# Patient Record
Sex: Female | Born: 1975
Health system: Southern US, Community
[De-identification: ages and names within clinical notes are randomized; demographics above are authoritative.]

## PROBLEM LIST (undated history)

## (undated) DIAGNOSIS — R6510 Systemic inflammatory response syndrome (SIRS) of non-infectious origin without acute organ dysfunction: Secondary | ICD-10-CM

## (undated) DIAGNOSIS — E0590 Thyrotoxicosis, unspecified without thyrotoxic crisis or storm: Secondary | ICD-10-CM

---

## 2017-09-26 ENCOUNTER — Emergency Department (HOSPITAL_BASED_OUTPATIENT_CLINIC_OR_DEPARTMENT_OTHER)
Admission: EM | Admit: 2017-09-26 | Discharge: 2017-09-26 | Disposition: A | Discharge status: Home / Self Care | Payer: Medicaid Other | Attending: Emergency Medicine | Admitting: Emergency Medicine

## 2017-09-26 ENCOUNTER — Other Ambulatory Visit: Payer: Self-pay

## 2017-09-26 ENCOUNTER — Encounter (HOSPITAL_BASED_OUTPATIENT_CLINIC_OR_DEPARTMENT_OTHER): Payer: Self-pay | Admitting: *Deleted

## 2017-09-26 DIAGNOSIS — R21 Rash and other nonspecific skin eruption: Secondary | ICD-10-CM | POA: Diagnosis present

## 2017-09-26 DIAGNOSIS — R140 Abdominal distension (gaseous): Secondary | ICD-10-CM | POA: Diagnosis not present

## 2017-09-26 DIAGNOSIS — L282 Other prurigo: Secondary | ICD-10-CM

## 2017-09-26 DIAGNOSIS — R110 Nausea: Secondary | ICD-10-CM | POA: Diagnosis not present

## 2017-09-26 LAB — URINALYSIS, ROUTINE W REFLEX MICROSCOPIC
BILIRUBIN URINE: NEGATIVE
Glucose, UA: NEGATIVE mg/dL
Hgb urine dipstick: NEGATIVE
Ketones, ur: NEGATIVE mg/dL
NITRITE: NEGATIVE
Protein, ur: NEGATIVE mg/dL
SPECIFIC GRAVITY, URINE: 1.02 (ref 1.005–1.030)
pH: 6.5 (ref 5.0–8.0)

## 2017-09-26 LAB — URINALYSIS, MICROSCOPIC (REFLEX): RBC / HPF: NONE SEEN RBC/hpf (ref 0–5)

## 2017-09-26 LAB — PREGNANCY, URINE: Preg Test, Ur: NEGATIVE

## 2017-09-26 MED ORDER — HYDROXYZINE HCL 25 MG PO TABS: 25.0000 mg | ORAL_TABLET | Freq: Four times a day (QID) | ORAL | 0 refills | Status: DC

## 2017-09-26 MED ORDER — PREDNISONE 20 MG PO TABS: ORAL_TABLET | ORAL | 0 refills | Status: DC

## 2017-09-26 MED ORDER — EUCERIN EX CREA
TOPICAL_CREAM | CUTANEOUS | 0 refills | Status: DC | PRN
Start: 2017-09-26 — End: 2018-04-11

## 2017-09-26 MED FILL — hydrOXYzine HCL 25 MG TABS: 25 | 3 days supply | Qty: 12 | Fill #0

## 2017-09-26 MED FILL — predniSONE 20 MG TABS: 20 | 5 days supply | Qty: 11 | Fill #0

## 2017-09-26 NOTE — ED Provider Notes (Signed)
[facility] EMERGENCY DEPARTMENT Provider Note   CSN: [id number] Arrival date & time: 09/26/17  1303     History   Chief Complaint Chief Complaint  Patient presents with  . Abdominal Pain    HPI Angie White is a 41 y.o. female.  Patient speaks Clydie BraunKaren. Interpreter service utilized to obtain history and ROS. Patient with pruritic rash, worse at night, for 2 weeks. No new foods, soaps, or detergents. No animals at home.  No shortness of breath, sore throat, or difficulty swallowing. Reports occasional abdominal discomfort, described as "gas" with bloating, occasional nausea, no vomiting or diarrhea. No focal area of abdominal discomfort. No fevers. No urinary symptoms.   The history is provided by the patient. The history is limited by a language barrier. A language interpreter was used.  Rash   This is a recurrent problem. The current episode started more than 1 week ago. The problem has been gradually worsening. The problem is associated with an unknown factor. There has been no fever. The rash is present on the torso, right arm, left upper leg, left lower leg, left arm, right upper leg, right lower leg, right foot and left foot. Associated symptoms include itching.    History reviewed. No pertinent past medical history.  There are no active problems to display for this patient.   History reviewed. No pertinent surgical history.  OB History    No data available       Home Medications    Prior to Admission medications   Not on File    Family History No family history on file.  Social History Social History   Tobacco Use  . Smoking status: Never Smoker  Substance Use Topics  . Alcohol use: No    Frequency: Never  . Drug use: No     Allergies   Patient has no known allergies.   Review of Systems Review of Systems  Constitutional: Negative for chills and fever.  Respiratory: Negative for shortness of breath.   Gastrointestinal: Positive for  nausea. Negative for diarrhea and vomiting.  Genitourinary: Negative for difficulty urinating, dysuria and urgency.  Skin: Positive for itching and rash.  All other systems reviewed and are negative.    Physical Exam Updated Vital Signs BP 138/65 (BP Location: Right Arm)   Pulse (!) 118   Temp 98.3 F (36.8 C) (Oral)   Resp 14   Ht 4\' 8"  (1.422 m)   Wt 53.1 kg (117 lb)   SpO2 98%   BMI 26.23 kg/m   Physical Exam  Constitutional: She is oriented to person, place, and time. She appears well-developed and well-nourished. She does not appear ill.  Eyes: Conjunctivae are normal.  Neck: Neck supple.  Cardiovascular: Normal rate and regular rhythm.  Pulmonary/Chest: Effort normal and breath sounds normal.  Abdominal: Soft. Bowel sounds are normal. She exhibits no distension. There is no tenderness.  Musculoskeletal: Normal range of motion. She exhibits edema.  Lymphadenopathy:    She has no cervical adenopathy.  Neurological: She is alert and oriented to person, place, and time.  Skin: Skin is warm and dry. Rash noted.  Psychiatric: She has a normal mood and affect.  Nursing note and vitals reviewed.    ED Treatments / Results  Labs (all labs ordered are listed, but only abnormal results are displayed) Labs Reviewed  URINALYSIS, ROUTINE W REFLEX MICROSCOPIC - Abnormal; Notable for the following components:      Result Value   Leukocytes, UA SMALL (*)  All other components within normal limits  URINALYSIS, MICROSCOPIC (REFLEX) - Abnormal; Notable for the following components:   Bacteria, UA RARE (*)    Squamous Epithelial / LPF 0-5 (*)    All other components within normal limits  PREGNANCY, URINE    EKG  EKG Interpretation None       Radiology No results found.  Procedures Procedures (including critical care time)  Medications Ordered in ED Medications - No data to display   Initial Impression / Assessment and Plan / ED Course  I have reviewed the  triage vital signs and the nursing notes.  Pertinent labs & imaging results that were available during my care of the patient were reviewed by me and considered in my medical decision making (see chart for details).    Patient with nonspecific eruption. No signs of infection. Discharge with symptomatic treatment. Follow up in 2-3 days if no improvement.   Final Clinical Impressions(s) / ED Diagnoses   Final diagnoses:  Pruritic rash    ED Discharge Orders        Ordered    predniSONE (DELTASONE) 20 MG tablet     09/26/17 1659    hydrOXYzine (ATARAX/VISTARIL) 25 MG tablet  Every 6 hours     09/26/17 1659    Skin Protectants, Misc. (EUCERIN) cream  As needed     09/26/17 1659       Felicie MornSmith, Areya Lemmerman, NP 09/26/17 1701    Gwyneth SproutPlunkett, Whitney, MD 09/27/17 [phone]

## 2017-09-26 NOTE — ED Triage Notes (Addendum)
Pt c/o rash x 2 weeks, also c/o abd pain and vomiting with weight loss x 2 weeks

## 2017-09-26 NOTE — ED Notes (Signed)
ED Provider at bedside. 

## 2017-10-02 ENCOUNTER — Other Ambulatory Visit: Payer: Self-pay

## 2017-10-02 ENCOUNTER — Emergency Department (HOSPITAL_BASED_OUTPATIENT_CLINIC_OR_DEPARTMENT_OTHER): Payer: Medicaid Other

## 2017-10-02 ENCOUNTER — Observation Stay (HOSPITAL_BASED_OUTPATIENT_CLINIC_OR_DEPARTMENT_OTHER)
Admission: EM | Admit: 2017-10-02 | Discharge: 2017-10-03 | Disposition: A | Discharge status: Home / Self Care | Payer: Medicaid Other | Attending: Family Medicine | Admitting: Family Medicine

## 2017-10-02 ENCOUNTER — Encounter (HOSPITAL_BASED_OUTPATIENT_CLINIC_OR_DEPARTMENT_OTHER): Payer: Self-pay | Admitting: Emergency Medicine

## 2017-10-02 DIAGNOSIS — E0590 Thyrotoxicosis, unspecified without thyrotoxic crisis or storm: Secondary | ICD-10-CM | POA: Insufficient documentation

## 2017-10-02 DIAGNOSIS — Z23 Encounter for immunization: Secondary | ICD-10-CM | POA: Diagnosis not present

## 2017-10-02 DIAGNOSIS — R6510 Systemic inflammatory response syndrome (SIRS) of non-infectious origin without acute organ dysfunction: Secondary | ICD-10-CM | POA: Diagnosis not present

## 2017-10-02 DIAGNOSIS — R945 Abnormal results of liver function studies: Secondary | ICD-10-CM | POA: Insufficient documentation

## 2017-10-02 DIAGNOSIS — R7989 Other specified abnormal findings of blood chemistry: Secondary | ICD-10-CM | POA: Diagnosis present

## 2017-10-02 DIAGNOSIS — R51 Headache: Secondary | ICD-10-CM | POA: Diagnosis present

## 2017-10-02 LAB — CBC WITH DIFFERENTIAL/PLATELET
BASOS ABS: 0 10*3/uL (ref 0.0–0.1)
BASOS PCT: 0 %
EOS ABS: 0.3 10*3/uL (ref 0.0–0.7)
Eosinophils Relative: 3 %
HEMATOCRIT: 41.6 % (ref 36.0–46.0)
HEMOGLOBIN: 14.3 g/dL (ref 12.0–15.0)
Lymphocytes Relative: 29 %
Lymphs Abs: 3.3 10*3/uL (ref 0.7–4.0)
MCH: 29.1 pg (ref 26.0–34.0)
MCHC: 34.4 g/dL (ref 30.0–36.0)
MCV: 84.6 fL (ref 78.0–100.0)
MONO ABS: 1.2 10*3/uL — AB (ref 0.1–1.0)
Monocytes Relative: 10 %
NEUTROS ABS: 6.3 10*3/uL (ref 1.7–7.7)
NEUTROS PCT: 58 %
Platelets: 267 10*3/uL (ref 150–400)
RBC: 4.92 MIL/uL (ref 3.87–5.11)
RDW: 12.4 % (ref 11.5–15.5)
WBC: 11.1 10*3/uL — ABNORMAL HIGH (ref 4.0–10.5)

## 2017-10-02 LAB — LIPASE, BLOOD: Lipase: 28 U/L (ref 11–51)

## 2017-10-02 LAB — URINALYSIS, ROUTINE W REFLEX MICROSCOPIC
Bilirubin Urine: NEGATIVE
GLUCOSE, UA: NEGATIVE mg/dL
Hgb urine dipstick: NEGATIVE
KETONES UR: NEGATIVE mg/dL
NITRITE: NEGATIVE
PH: 6.5 (ref 5.0–8.0)
Protein, ur: NEGATIVE mg/dL
SPECIFIC GRAVITY, URINE: 1.025 (ref 1.005–1.030)

## 2017-10-02 LAB — COMPREHENSIVE METABOLIC PANEL
ALBUMIN: 3.6 g/dL (ref 3.5–5.0)
ALT: 101 U/L — ABNORMAL HIGH (ref 14–54)
AST: 58 U/L — AB (ref 15–41)
Alkaline Phosphatase: 98 U/L (ref 38–126)
Anion gap: 9 (ref 5–15)
BILIRUBIN TOTAL: 0.7 mg/dL (ref 0.3–1.2)
BUN: 16 mg/dL (ref 6–20)
CO2: 25 mmol/L (ref 22–32)
Calcium: 9.6 mg/dL (ref 8.9–10.3)
Chloride: 102 mmol/L (ref 101–111)
Creatinine, Ser: 0.44 mg/dL (ref 0.44–1.00)
GFR calc Af Amer: >60 mL/min (ref >60)
GFR calc non Af Amer: >60 mL/min (ref >60)
GLUCOSE: 124 mg/dL — AB (ref 65–99)
POTASSIUM: 3.4 mmol/L — AB (ref 3.5–5.1)
SODIUM: 136 mmol/L (ref 135–145)
TOTAL PROTEIN: 7.2 g/dL (ref 6.5–8.1)

## 2017-10-02 LAB — URINALYSIS, MICROSCOPIC (REFLEX): RBC / HPF: NONE SEEN RBC/hpf (ref 0–5)

## 2017-10-02 LAB — T4, FREE: Free T4: 5.26 ng/dL — ABNORMAL HIGH (ref 0.61–1.12)

## 2017-10-02 LAB — TSH

## 2017-10-02 LAB — PREGNANCY, URINE: Preg Test, Ur: NEGATIVE

## 2017-10-02 MED ORDER — ONDANSETRON HCL 4 MG/2ML IJ SOLN: 4.0000 mg | Freq: Four times a day (QID) | INTRAMUSCULAR | Status: DC | PRN

## 2017-10-02 MED ORDER — SODIUM CHLORIDE 0.9 % IV SOLN
INTRAVENOUS | Status: DC
  Administered 2017-10-02 – 2017-10-03 (×2): via INTRAVENOUS

## 2017-10-02 MED ORDER — METOCLOPRAMIDE HCL 5 MG/ML IJ SOLN
10.0000 mg | Freq: Once | INTRAMUSCULAR | Status: AC
  Administered 2017-10-02: 10 mg via INTRAVENOUS
  Filled 2017-10-02: qty 2

## 2017-10-02 MED ORDER — PROPRANOLOL HCL 1 MG/ML IV SOLN
1.0000 mg | Freq: Once | INTRAVENOUS | Status: AC
Start: 2017-10-02 — End: 2017-10-02
  Administered 2017-10-02: 1 mg via INTRAVENOUS
  Filled 2017-10-02: qty 1

## 2017-10-02 MED ORDER — ACETAMINOPHEN 650 MG RE SUPP: 650.0000 mg | Freq: Four times a day (QID) | RECTAL | Status: DC | PRN

## 2017-10-02 MED ORDER — ONDANSETRON HCL 4 MG PO TABS: 4.0000 mg | ORAL_TABLET | Freq: Four times a day (QID) | ORAL | Status: DC | PRN

## 2017-10-02 MED ORDER — ACETAMINOPHEN 325 MG PO TABS: 650.0000 mg | ORAL_TABLET | Freq: Four times a day (QID) | ORAL | Status: DC | PRN

## 2017-10-02 MED ORDER — GI COCKTAIL ~~LOC~~
30.0000 mL | Freq: Once | ORAL | Status: AC
  Administered 2017-10-02: 30 mL via ORAL
  Filled 2017-10-02: qty 30

## 2017-10-02 MED ORDER — PROPRANOLOL HCL 40 MG PO TABS
40.0000 mg | ORAL_TABLET | Freq: Once | ORAL | Status: DC
  Filled 2017-10-02: qty 1

## 2017-10-02 MED ORDER — SODIUM CHLORIDE 0.9 % IV BOLUS (SEPSIS)
1000.0000 mL | Freq: Once | INTRAVENOUS | Status: AC
  Administered 2017-10-02: 1000 mL via INTRAVENOUS

## 2017-10-02 MED ORDER — DEXAMETHASONE SODIUM PHOSPHATE 10 MG/ML IJ SOLN
10.0000 mg | Freq: Once | INTRAMUSCULAR | Status: AC
  Administered 2017-10-02: 10 mg via INTRAVENOUS
  Filled 2017-10-02: qty 1

## 2017-10-02 MED ORDER — METOPROLOL TARTRATE 25 MG PO TABS
12.5000 mg | ORAL_TABLET | Freq: Two times a day (BID) | ORAL | Status: DC
  Administered 2017-10-02 – 2017-10-03 (×2): 12.5 mg via ORAL
  Filled 2017-10-02 (×2): qty 1

## 2017-10-02 MED ORDER — DIPHENHYDRAMINE HCL 50 MG/ML IJ SOLN
25.0000 mg | Freq: Once | INTRAMUSCULAR | Status: AC
  Administered 2017-10-02: 25 mg via INTRAVENOUS
  Filled 2017-10-02: qty 1

## 2017-10-02 MED ORDER — INFLUENZA VAC SPLIT QUAD 0.5 ML IM SUSY
0.5000 mL | PREFILLED_SYRINGE | INTRAMUSCULAR | Status: AC
  Administered 2017-10-03: 0.5 mL via INTRAMUSCULAR
  Filled 2017-10-02: qty 0.5

## 2017-10-02 NOTE — ED Provider Notes (Signed)
[facility] EMERGENCY DEPARTMENT Provider Note   CSN: [id number] Arrival date & time: 10/02/17  0840     History   Chief Complaint Chief Complaint  Patient presents with  . Headache  . Medication Reaction    HPI Angie White is a 41 y.o. female.  Patient presents to the emergency department with a chief complaint of multiple problems.  She reports having nausea, vomiting, generalized pruritus for the past several weeks.  She states that she was seen here approximately 1 week ago and prescribed prednisone, Atarax, and Eucerin.  She states that she did have some good relief with this medication, but now she is out, and her symptoms have returned.  She reports having daily episodes of vomiting.  She reports anorexia.  Additionally, she states that she began having a headache last night which is progressively worsened until now.  She denies any new numbness, weakness, or tingling, but states that she did have some tingling in her right hand approximately 1 week ago.  This has resolved.  Denies any chest pain or shortness of breath, but does report feeling fatigued.  She denies any abdominal pain, hematemesis, hematochezia, or melena.  She reports that she has been constipated.  Denies any recent foreign travel.  Denies any other medical problems.   The history is provided by the patient. The history is limited by a language barrier. A language interpreter was used.    No past medical history on file.  There are no active problems to display for this patient.   No past surgical history on file.  OB History    No data available       Home Medications    Prior to Admission medications   Medication Sig Start Date End Date Taking? Authorizing Provider  hydrOXYzine (ATARAX/VISTARIL) 25 MG tablet Take 1 tablet (25 mg total) by mouth every 6 (six) hours. 09/26/17   Felicie MornSmith, David, NP  predniSONE (DELTASONE) 20 MG tablet 3 tabs po day one, then 2 tabs daily x 4 days 09/26/17    Felicie MornSmith, David, NP  Skin Protectants, Misc. (EUCERIN) cream Apply topically as needed for dry skin. 09/26/17   Felicie MornSmith, David, NP    Family History No family history on file.  Social History Social History   Tobacco Use  . Smoking status: Never Smoker  . Smokeless tobacco: Never Used  Substance Use Topics  . Alcohol use: No    Frequency: Never  . Drug use: No     Allergies   Patient has no known allergies.   Review of Systems Review of Systems  All other systems reviewed and are negative.    Physical Exam Updated Vital Signs BP 130/81   Pulse 100   Temp 97.9 F (36.6 C)   Resp 16   Ht 4\' 8"  (1.422 m)   Wt 53.1 kg (117 lb)   SpO2 96%   BMI 26.23 kg/m   Physical Exam  Constitutional: She is oriented to person, place, and time. She appears well-developed and well-nourished.  HENT:  Head: Normocephalic and atraumatic.  Right Ear: External ear normal.  Left Ear: External ear normal.  Eyes: Conjunctivae and EOM are normal. Pupils are equal, round, and reactive to light.  Neck: Normal range of motion. Neck supple.  No pain with neck flexion, no meningismus  Cardiovascular: Normal rate, regular rhythm and normal heart sounds. Exam reveals no gallop and no friction rub.  No murmur heard. Pulmonary/Chest: Effort normal and breath sounds normal. No  respiratory distress. She has no wheezes. She has no rales. She exhibits no tenderness.  Lungs are clear to auscultation  Abdominal: Soft. Bowel sounds are normal. She exhibits no distension and no mass. There is no tenderness. There is no rebound and no guarding.  No focal tenderness  Musculoskeletal: Normal range of motion. She exhibits no edema or tenderness.  Normal gait.  Neurological: She is alert and oriented to person, place, and time. She has normal reflexes.  CN 3-12 intact, normal finger to nose, no pronator drift, sensation and strength intact bilaterally.  Skin: Skin is warm and dry.  No visible rash    Psychiatric: She has a normal mood and affect. Her behavior is normal. Judgment and thought content normal.  Nursing note and vitals reviewed.    ED Treatments / Results  Labs (all labs ordered are listed, but only abnormal results are displayed) Labs Reviewed  CBC WITH DIFFERENTIAL/PLATELET  COMPREHENSIVE METABOLIC PANEL  LIPASE, BLOOD  URINALYSIS, ROUTINE W REFLEX MICROSCOPIC  PREGNANCY, URINE  TSH    EKG  EKG Interpretation None       Radiology No results found.  Procedures Procedures (including critical care time) CRITICAL CARE Performed by: Roxy Horsemanobert Tierre Netto   Total critical care time: 48 minutes  Critical care time was exclusive of separately billable procedures and treating other patients.  Critical care was necessary to treat or prevent imminent or life-threatening deterioration.  Critical care was time spent personally by me on the following activities: development of treatment plan with patient and/or surrogate as well as nursing, discussions with consultants, evaluation of patient's response to treatment, examination of patient, obtaining history from patient or surrogate, ordering and performing treatments and interventions, ordering and review of laboratory studies, ordering and review of radiographic studies, pulse oximetry and re-evaluation of patient's condition.  Medications Ordered in ED Medications  sodium chloride 0.9 % bolus 1,000 mL (not administered)  gi cocktail (Maalox,Lidocaine,Donnatal) (not administered)  metoCLOPramide (REGLAN) injection 10 mg (not administered)  diphenhydrAMINE (BENADRYL) injection 25 mg (not administered)  dexamethasone (DECADRON) injection 10 mg (not administered)     Initial Impression / Assessment and Plan / ED Course  I have reviewed the triage vital signs and the nursing notes.  Pertinent labs & imaging results that were available during my care of the patient were reviewed by me and considered in my medical  decision making (see chart for details).     With many complaints, including headache, nausea, vomiting, dizziness, pruritus, fatigue, anorexia, all of which have been ongoing for the past week or so.  Headache worsened last night and into today.  Triage.  Will give fluids.  Will check labs.  Patient may benefit from CT imaging of her head.  Will check TSH, EKG.  Will try GI cocktail and headache cocktail.  Patient is nontoxic-appearing.   Patient feels improved, but remains tachycardic to the 110s.  Normal temp.  Does have nausea and vomiting.  TSH <0.01.  This is new.  Will give IV propranolol.    Discussed with Dr. Rush Landmarkegeler, who agrees with plan for admission.  Appreciate Dr. Rito EhrlichKrishnan [facility].  Final Clinical Impressions(s) / ED Diagnoses   Final diagnoses:  Hyperthyroidism    ED Discharge Orders    None       Roxy HorsemanBrowning, Avryl Roehm, PA-C 10/02/17 [id number]    Tegeler, Canary Brimhristopher J, MD 10/02/17 [phone]

## 2017-10-02 NOTE — ED Notes (Signed)
Patient is resting comfortably. 

## 2017-10-02 NOTE — ED Notes (Signed)
Pt to radiology.

## 2017-10-02 NOTE — ED Triage Notes (Signed)
Pt started having headache, some dizziness, nausea and vomiting that began several days after taking medication given in ED.  Symptoms worse last night.

## 2017-10-02 NOTE — H&P (Signed)
History and Physical    Angie White ZOX:[id number] DOB: 08/09/1976 DOA: 10/02/2017  PCP: [facility]  Patient coming from: Home.  Chief Complaint: Headache palpitations nausea vomiting.  Patient talks Clydie BraunKaren language of [place].  Interpreter used.  HPI: Angie White is a 41 y.o. female with no significant past medical history [facility] with complaints of headache crepitations nausea vomiting.  Patient symptoms started off yesterday morning.  Prior to this 1 week ago patient has been having some pruritus and was prescribed prednisone and skin cream which patient took.  ED Course: In the ER patient was found to be tachycardic with EKG showing sinus tachycardia since patient had persistent headache with nausea vomiting CT head was done which was unremarkable.  Patient also had LFTs which was mildly elevated.  Sonogram of the abdomen was unremarkable.  TSH was markedly low with free T4 and free T3 are pending.  Patient admitted for possible thyrotoxicosis.  Prior to transfer patient was given 1 dose of IV propranolol.  Review of Systems: As per HPI, rest all negative.   History reviewed. No pertinent past medical history.  History reviewed. No pertinent surgical history.   reports that  has never smoked. she has never used smokeless tobacco. She reports that she does not drink alcohol or use drugs.  No Known Allergies  Family History  Problem Relation Age of Onset  . Diabetes Mellitus II Neg Hx     Prior to Admission medications   Medication Sig Start Date End Date Taking? Authorizing Provider  hydrOXYzine (ATARAX/VISTARIL) 25 MG tablet Take 1 tablet (25 mg total) by mouth every 6 (six) hours. 09/26/17  Yes Felicie MornSmith, David, NP  Skin Protectants, Misc. (EUCERIN) cream Apply topically as needed for dry skin. 09/26/17  Yes Felicie MornSmith, David, NP  predniSONE (DELTASONE) 20 MG tablet 3 tabs po day one, then 2 tabs daily x 4 days Patient not  taking: Reported on 10/02/2017 09/26/17   Felicie MornSmith, David, NP    Physical Exam: Vitals:   10/02/17 1600 10/02/17 1738 10/02/17 2001 10/02/17 2147  BP: 125/60 (!) 125/59 135/65   Pulse: (!) 111 (!) 116 (!) 121 (!) 114  Resp: (!) 27 18 (!) 22   Temp:   99.2 F (37.3 C)   TempSrc:   Oral   SpO2: 97% 100% 96%   Weight:      Height:          Constitutional: Moderately built and nourished. Vitals:   10/02/17 1600 10/02/17 1738 10/02/17 2001 10/02/17 2147  BP: 125/60 (!) 125/59 135/65   Pulse: (!) 111 (!) 116 (!) 121 (!) 114  Resp: (!) 27 18 (!) 22   Temp:   99.2 F (37.3 C)   TempSrc:   Oral   SpO2: 97% 100% 96%   Weight:      Height:       Eyes: Anicteric no pallor. ENMT: No discharge from the ears eyes nose and mouth. Neck: No mass felt.  No neck rigidity. Respiratory: No rhonchi or crepitations. Cardiovascular: S1-S2 heard no murmurs appreciated. Abdomen: Soft nontender bowel sounds present. Musculoskeletal: No edema.  No joint effusion. Skin: No rash.  Skin appears warm. Neurologic: Alert awake oriented to time place and person.  Moves all extremities. Psychiatric: Appears normal.  Normal affect.   Labs on Admission: I have personally reviewed following labs and imaging studies  CBC: Recent Labs  Lab 10/02/17 0938  WBC 11.1*  NEUTROABS 6.3  HGB 14.3  HCT 41.6  MCV 84.6  PLT 267   Basic Metabolic Panel: Recent Labs  Lab 10/02/17 0938  NA 136  K 3.4*  CL 102  CO2 25  GLUCOSE 124*  BUN 16  CREATININE 0.44  CALCIUM 9.6   GFR: Estimated Creatinine Clearance: 62.8 mL/min (by C-G formula based on SCr of 0.44 mg/dL). Liver Function Tests: Recent Labs  Lab 10/02/17 0938  AST 58*  ALT 101*  ALKPHOS 98  BILITOT 0.7  PROT 7.2  ALBUMIN 3.6   Recent Labs  Lab 10/02/17 0938  LIPASE 28   No results for input(s): AMMONIA in the last 168 hours. Coagulation Profile: No results for input(s): INR, PROTIME in the last 168 hours. Cardiac Enzymes: No  results for input(s): CKTOTAL, CKMB, CKMBINDEX, TROPONINI in the last 168 hours. BNP (last 3 results) No results for input(s): PROBNP in the last 8760 hours. HbA1C: No results for input(s): HGBA1C in the last 72 hours. CBG: No results for input(s): GLUCAP in the last 168 hours. Lipid Profile: No results for input(s): CHOL, HDL, LDLCALC, TRIG, CHOLHDL, LDLDIRECT in the last 72 hours. Thyroid Function Tests: Recent Labs    10/02/17 0938  TSH <0.010*  FREET4 5.26*   Anemia Panel: No results for input(s): VITAMINB12, FOLATE, FERRITIN, TIBC, IRON, RETICCTPCT in the last 72 hours. Urine analysis:    Component Value Date/Time   COLORURINE YELLOW 10/02/2017 1040   APPEARANCEUR HAZY (A) 10/02/2017 1040   LABSPEC 1.025 10/02/2017 1040   PHURINE 6.5 10/02/2017 1040   GLUCOSEU NEGATIVE 10/02/2017 1040   HGBUR NEGATIVE 10/02/2017 1040   BILIRUBINUR NEGATIVE 10/02/2017 1040   KETONESUR NEGATIVE 10/02/2017 1040   PROTEINUR NEGATIVE 10/02/2017 1040   NITRITE NEGATIVE 10/02/2017 1040   LEUKOCYTESUR SMALL (A) 10/02/2017 1040   Sepsis Labs: @LABRCNTIP (procalcitonin:4,lacticidven:4) )No results found for this or any previous visit (from the past 240 hour(s)).   Radiological Exams on Admission: Ct Head Wo Contrast  Result Date: 10/02/2017 CLINICAL DATA:  Headache. EXAM: CT HEAD WITHOUT CONTRAST TECHNIQUE: Contiguous axial images were obtained from the base of the skull through the vertex without intravenous contrast. COMPARISON:  None. FINDINGS: Brain: No evidence of acute infarction, hemorrhage, hydrocephalus, extra-axial collection or mass lesion/mass effect. Vascular: No hyperdense vessel or unexpected calcification. Skull: Normal. Negative for fracture or focal lesion. Sinuses/Orbits: No acute finding. Other: None. IMPRESSION: Normal head CT. Electronically Signed   By: Lupita RaiderJames  Green Jr, M.D.   On: 10/02/2017 09:51   [place] Abdomen Limited  Result Date: 10/02/2017 CLINICAL DATA:  Right upper  quadrant abdominal pain with nausea, vomiting and weight loss for 2 weeks. EXAM: ULTRASOUND ABDOMEN LIMITED RIGHT UPPER QUADRANT COMPARISON:  None. FINDINGS: Gallbladder: No gallstones or wall thickening visualized. No sonographic Murphy sign noted by sonographer. Common bile duct: Diameter: 3 mm Liver: No focal lesion identified. Within normal limits in parenchymal echogenicity. Portal vein is patent on color Doppler imaging with normal direction of blood flow towards the liver. IMPRESSION: Normal right upper quadrant abdominal ultrasound. Electronically Signed   By: Carey BullocksWilliam  Veazey M.D.   On: 10/02/2017 11:58    EKG: Independently reviewed.  Sinus tachycardia.  Assessment/Plan Principal Problem:   SIRS (systemic inflammatory response syndrome) (HCC) Active Problems:   Thyrotoxicosis   Elevated LFTs    1. SIRS likely from thyrotoxicosis -free T4 and free T3 are pending.  For now I have kept patient on metoprolol 12.5 mg p.o. twice daily to rate control.  Further recommendations based  on pending results. 2. Elevated LFTs likely from thyrotoxicosis -follow LFTs.  Check acute hepatitis panel.  Abdomen appears benign and sonogram was unremarkable.   DVT prophylaxis: SCDs. Code Status: Full code. Family Communication: Patient's family. Disposition Plan: Home. Consults called: None. Admission status: Observation.   Eduard Clos MD [facility] Pager [phone].  If 7PM-7AM, please contact night-coverage [email or web address] Password [email or web address]  10/02/2017, 10:05 PM

## 2017-10-02 NOTE — ED Notes (Signed)
Patient transported to ultrasound.

## 2017-10-03 DIAGNOSIS — R945 Abnormal results of liver function studies: Secondary | ICD-10-CM | POA: Diagnosis not present

## 2017-10-03 DIAGNOSIS — R6510 Systemic inflammatory response syndrome (SIRS) of non-infectious origin without acute organ dysfunction: Secondary | ICD-10-CM

## 2017-10-03 DIAGNOSIS — E0580 Other thyrotoxicosis without thyrotoxic crisis or storm: Secondary | ICD-10-CM | POA: Diagnosis not present

## 2017-10-03 DIAGNOSIS — E0590 Thyrotoxicosis, unspecified without thyrotoxic crisis or storm: Secondary | ICD-10-CM | POA: Diagnosis not present

## 2017-10-03 LAB — HEPATIC FUNCTION PANEL
ALBUMIN: 3.2 g/dL — AB (ref 3.5–5.0)
ALK PHOS: 101 U/L (ref 38–126)
ALT: 79 U/L — AB (ref 14–54)
AST: 36 U/L (ref 15–41)
BILIRUBIN TOTAL: 0.1 mg/dL — AB (ref 0.3–1.2)
Bilirubin, Direct: <0.1 mg/dL — ABNORMAL LOW (ref 0.1–0.5)
Total Protein: 6.3 g/dL — ABNORMAL LOW (ref 6.5–8.1)

## 2017-10-03 LAB — CBC
HEMATOCRIT: 36.7 % (ref 36.0–46.0)
HEMOGLOBIN: 12.3 g/dL (ref 12.0–15.0)
MCH: 28.9 pg (ref 26.0–34.0)
MCHC: 33.5 g/dL (ref 30.0–36.0)
MCV: 86.2 fL (ref 78.0–100.0)
Platelets: 258 10*3/uL (ref 150–400)
RBC: 4.26 MIL/uL (ref 3.87–5.11)
RDW: 12.7 % (ref 11.5–15.5)
WBC: 21.3 10*3/uL — AB (ref 4.0–10.5)

## 2017-10-03 LAB — BASIC METABOLIC PANEL
ANION GAP: 9 (ref 5–15)
BUN: 14 mg/dL (ref 6–20)
CO2: 21 mmol/L — ABNORMAL LOW (ref 22–32)
Calcium: 8.9 mg/dL (ref 8.9–10.3)
Chloride: 105 mmol/L (ref 101–111)
Creatinine, Ser: 0.45 mg/dL (ref 0.44–1.00)
GFR calc Af Amer: >60 mL/min (ref >60)
GFR calc non Af Amer: >60 mL/min (ref >60)
Glucose, Bld: 282 mg/dL — ABNORMAL HIGH (ref 65–99)
POTASSIUM: 3.7 mmol/L (ref 3.5–5.1)
SODIUM: 135 mmol/L (ref 135–145)

## 2017-10-03 LAB — T3, FREE: T3, Free: 23.9 pg/mL — ABNORMAL HIGH (ref 2.0–4.4)

## 2017-10-03 LAB — PROTIME-INR
INR: 0.93
Prothrombin Time: 12.3 seconds (ref 11.4–15.2)

## 2017-10-03 LAB — HIV ANTIBODY (ROUTINE TESTING W REFLEX): HIV SCREEN 4TH GENERATION: NONREACTIVE

## 2017-10-03 MED ORDER — METOPROLOL TARTRATE 25 MG PO TABS: 25.0000 mg | ORAL_TABLET | Freq: Two times a day (BID) | ORAL | 0 refills | Status: DC

## 2017-10-03 NOTE — Progress Notes (Signed)
Nutrition Brief Note  Patient identified on the Malnutrition Screening Tool (MST) Report  Wt Readings from Last 15 Encounters:  10/02/17 117 lb (53.1 kg)  09/26/17 117 lb (53.1 kg)    Body mass index is 26.23 kg/m. Patient meets criteria for overweight based on current BMI.   Current diet order is regular, patient is consuming approximately 100% of meals at this time. Pt reports having great appetite. RD observed lunch tray at bedside with 100% eaten. Pt to discharge today per nurse reports.  Labs and medications reviewed.   No nutrition interventions warranted at this time. If nutrition issues arise, please consult RD.   Angie White RD, LDN Clinical Nutrition Pager # [phone]- [phone]

## 2017-10-03 NOTE — Progress Notes (Signed)
Pt discharged to home, instructions reveiwd with patient. SRP, RN

## 2017-10-03 NOTE — Discharge Summary (Signed)
Physician Discharge Summary  Angie White PYP:[id number] DOB: 04-27-1976 DOA: 10/02/2017  PCP: [facility]  Admit date: 10/02/2017 Discharge date: 10/03/2017  Admitted From: Home Disposition: Home   Recommendations for Outpatient Follow-up:  1. Follow up with endocrinology, Dr. Buddy Duty, on 12/21 2. Monitor HR, BP; started metoprolol '25mg'$  po BID 3. Follow up labs drawn and pending at discharge:  1. Anti-TPO 2. Thyrotropin receptor Auto-Ab 3. Thyroid stimulating IG  Home Health: None Equipment/Devices: None Discharge Condition: Stable CODE STATUS: Full Diet recommendation: As tolerated  Brief/Interim Summary: Angie White is a 41 y.o. female [place] refugee who entered the [place] earlier 2018 who presented to [facility] ED for multiple complaints on 12/17 including headache, palpitations, fatigue, and nausea. These symptoms began an insidious onset about a month prior, but were not bothersome at that time. She was evaluated previously for dry skin and given prednisone and eucerin, but returned principally for the worsening palpitations over the past week.   In the ED she was afebrile with sinus tachycardia around 110's. CT head was unremarkable. LFT's were mildly abnormal (AST 58, ALT 101, normal Alk Phos and Bili) , though subsequent RUQ U/S was normal. TSH was undetectable. She was given IV fluids and propranolol and transferred to [facility] for observation. With this her symptoms resolved. HR improved, headache, nausea and vomiting subsided, and LFTs improved (AST 36, ALT 79). Dr. Buddy Duty was consulted by phone and has scheduled the patient to follow up later this week.   Discharge Diagnoses:  Principal Problem:   SIRS (systemic inflammatory response syndrome) (HCC) Active Problems:   Thyrotoxicosis   Elevated LFTs  SIRS likely from thyrotoxicosis: No evidence of infection noted.  - Consider repeat CBC (leukocytosis suspected to be reactive without neutrophilic  predominance)  Hyperthyroidism: Thyroid studies as below, no hyperthermia and no nodules on exam. hCG neg, on depomedrol. Reported symptoms since November, but worsening over past 1-2 weeks. Improved symptoms and vital signs on beta blocker which will be continued. Case discussed (by phone only) with endocrinology, Dr. Buddy Duty.  - Follow up scheduled prior to discharge on 12/21. Discharge instructions given with assistance of Angie White [facility], Vonna Kotyk #[id number] for physician evaluation) Work-up labs drawn prior to discharge:  - Anti-TPO - Thyrotropin receptor Auto-Ab - Thyroid stimulating IG  Elevated LFTs: Mild, likely from thyrotoxicosis. Was also started on lipitor, per EMR for LDL of 178, though she denies taking this. Ultrasound unremarkable.  - Hepatitis panel pending - Consider repeat CMP at follow up, especially if considering initiation of methimazole.  Discharge Instructions Discharge Instructions    Discharge instructions   Complete by:  As directed    Please provide instructions using KAREN interpretor.   You were seen for HYPERTHYROIDISM, which is an increased level of thyroid hormone which has caused dry skin, headache, nausea, and palpitations which have improved with treatment.  - Take metoprolol twice daily to control your symptoms (sent to your pharmacy). This is very important.  - Follow up on Friday at 12:40pm with the ENDOCRINOLOGIST, Dr. Buddy Duty. This is also very important.  - If you feel significantly worse before this appointment, seek medical attention.     Allergies as of 10/03/2017   No Known Allergies     Medication List    STOP taking these medications   predniSONE 20 MG tablet Commonly known as:  DELTASONE     TAKE these medications   eucerin cream Apply topically as needed for dry skin.   hydrOXYzine 25 MG  tablet Commonly known as:  ATARAX/VISTARIL Take 1 tablet (25 mg total) by mouth every 6 (six) hours.   metoprolol tartrate 25 MG  tablet Commonly known as:  LOPRESSOR Take 1 tablet (25 mg total) by mouth 2 (two) times daily.      Follow-up Information    Delrae Rend, MD Follow up on 10/06/2017.   Specialty:  Endocrinology Why:  12:40pm Contact information: [address] [place] [address] [place], [facility] Follow up.   Specialty:  Pediatrics Contact information: [address] [place]  [address] [phone]          No Known Allergies  Consultations:  Dr. Buddy Duty by phone only on 12/18  Procedures/Studies: Ct Head Wo Contrast  Result Date: 10/02/2017 CLINICAL DATA:  Headache. EXAM: CT HEAD WITHOUT CONTRAST TECHNIQUE: Contiguous axial images were obtained from the base of the skull through the vertex without intravenous contrast. COMPARISON:  None. FINDINGS: Brain: No evidence of acute infarction, hemorrhage, hydrocephalus, extra-axial collection or mass lesion/mass effect. Vascular: No hyperdense vessel or unexpected calcification. Skull: Normal. Negative for fracture or focal lesion. Sinuses/Orbits: No acute finding. Other: None. IMPRESSION: Normal head CT. Electronically Signed   By: Marijo Conception, M.D.   On: 10/02/2017 09:51   US Abdomen Limited  Result Date: 10/02/2017 CLINICAL DATA:  Right upper quadrant abdominal pain with nausea, vomiting and weight loss for 2 weeks. EXAM: ULTRASOUND ABDOMEN LIMITED RIGHT UPPER QUADRANT COMPARISON:  None. FINDINGS: Gallbladder: No gallstones or wall thickening visualized. No sonographic Murphy sign noted by sonographer. Common bile duct: Diameter: 3 mm Liver: No focal lesion identified. Within normal limits in parenchymal echogenicity. Portal vein is patent on color Doppler imaging with normal direction of blood flow towards the liver. IMPRESSION: Normal right upper quadrant abdominal ultrasound. Electronically Signed   By: Richardean Sale M.D.   On: 10/02/2017 11:58   Subjective: Feels  better, still with some skin itching though not in any particular area, just "all over." Headache and nausea subsided, has eaten breakfast without symptoms. No fever. Feels tired but no weakness. Palpitations resolved. The air conditioning is blasting in her hospital room.  Discharge Exam: Vitals:   10/03/17 0437 10/03/17 1022  BP: 117/82 135/62  Pulse: 94 (!) 105  Resp: 18   Temp: 97.8 F (36.6 C)   SpO2: 96%    General: Short, pleasant, interactive 41yo F in no distress.  HEENT: Poor dentition, no proptosis, PERRL Neck: Supple, no tenderness to palpation, lymphadenopathy, or palpable nodules Cardiovascular: Regular rate (90's) and rhythm without murmur, rub or gallop. No JVD or edema.  Respiratory: CTA bilaterally, no wheezing, no rhonchi Abdominal: Soft, NT, ND, bowel sounds + Skin: Diffusely dry skin without wounds or localized rashes.   Labs: Basic Metabolic Panel: Recent Labs  Lab 10/02/17 0938 10/03/17 0439  NA 136 135  K 3.4* 3.7  CL 102 105  CO2 25 21*  GLUCOSE 124* 282*  BUN 16 14  CREATININE 0.44 0.45  CALCIUM 9.6 8.9   Liver Function Tests: Recent Labs  Lab 10/02/17 0938 10/03/17 0439  AST 58* 36  ALT 101* 79*  ALKPHOS 98 101  BILITOT 0.7 0.1*  PROT 7.2 6.3*  ALBUMIN 3.6 3.2*   Recent Labs  Lab 10/02/17 0938  LIPASE 28   CBC: Recent Labs  Lab 10/02/17 0938 10/03/17 0439  WBC 11.1* 21.3*  NEUTROABS 6.3  --   HGB 14.3  12.3  HCT 41.6 36.7  MCV 84.6 86.2  PLT 267 258   Thyroid function studies Recent Labs    10/02/17 0938  TSH <0.010*  T4 FREE 5.26*  T3FREE 23.9*   Urinalysis    Component Value Date/Time   COLORURINE YELLOW 10/02/2017 1040   APPEARANCEUR HAZY (A) 10/02/2017 1040   LABSPEC 1.025 10/02/2017 1040   PHURINE 6.5 10/02/2017 1040   GLUCOSEU NEGATIVE 10/02/2017 1040   HGBUR NEGATIVE 10/02/2017 1040   BILIRUBINUR NEGATIVE 10/02/2017 1040   KETONESUR NEGATIVE 10/02/2017 1040   PROTEINUR NEGATIVE 10/02/2017 1040    NITRITE NEGATIVE 10/02/2017 1040   LEUKOCYTESUR SMALL (A) 10/02/2017 1040   Time coordinating discharge: Approximately 40 minutes  Vance Gather, MD  [facility] 10/03/2017, 12:00 PM Pager [phone]

## 2017-10-04 LAB — THYROTROPIN RECEPTOR AUTOABS: Thyrotropin Receptor Ab: 6.31 IU/L — ABNORMAL HIGH (ref 0.00–1.75)

## 2017-10-04 LAB — THYROID STIMULATING IMMUNOGLOBULIN: THYROID STIMULATING IMMUNOGLOB: 5 IU/L — AB (ref 0.00–0.55)

## 2017-10-04 LAB — HEPATITIS PANEL, ACUTE
HCV Ab: <0.1 s/co ratio (ref 0.0–0.9)
HEP B C IGM: NEGATIVE
Hep A IgM: NEGATIVE
Hepatitis B Surface Ag: NEGATIVE

## 2017-10-04 LAB — THYROID PEROXIDASE ANTIBODY: THYROID PEROXIDASE ANTIBODY: 122 [IU]/mL — AB (ref 0–34)

## 2017-10-20 ENCOUNTER — Encounter (HOSPITAL_BASED_OUTPATIENT_CLINIC_OR_DEPARTMENT_OTHER): Payer: Self-pay | Admitting: Emergency Medicine

## 2017-10-20 ENCOUNTER — Emergency Department (HOSPITAL_BASED_OUTPATIENT_CLINIC_OR_DEPARTMENT_OTHER): Payer: Medicaid Other

## 2017-10-20 ENCOUNTER — Other Ambulatory Visit: Payer: Self-pay

## 2017-10-20 ENCOUNTER — Emergency Department (HOSPITAL_BASED_OUTPATIENT_CLINIC_OR_DEPARTMENT_OTHER)
Admission: EM | Admit: 2017-10-20 | Discharge: 2017-10-20 | Disposition: A | Discharge status: Home / Self Care | Payer: Medicaid Other | Attending: Emergency Medicine | Admitting: Emergency Medicine

## 2017-10-20 DIAGNOSIS — E0590 Thyrotoxicosis, unspecified without thyrotoxic crisis or storm: Secondary | ICD-10-CM

## 2017-10-20 DIAGNOSIS — R21 Rash and other nonspecific skin eruption: Secondary | ICD-10-CM | POA: Diagnosis present

## 2017-10-20 DIAGNOSIS — L299 Pruritus, unspecified: Secondary | ICD-10-CM | POA: Insufficient documentation

## 2017-10-20 HISTORY — DX: Thyrotoxicosis, unspecified without thyrotoxic crisis or storm: E05.90

## 2017-10-20 HISTORY — DX: Systemic inflammatory response syndrome (sirs) of non-infectious origin without acute organ dysfunction: R65.10

## 2017-10-20 LAB — URINALYSIS, ROUTINE W REFLEX MICROSCOPIC
Bilirubin Urine: NEGATIVE
GLUCOSE, UA: 100 mg/dL — AB
Hgb urine dipstick: NEGATIVE
KETONES UR: NEGATIVE mg/dL
Leukocytes, UA: NEGATIVE
Nitrite: NEGATIVE
PH: 7 (ref 5.0–8.0)
Protein, ur: NEGATIVE mg/dL
Specific Gravity, Urine: <1.005 — ABNORMAL LOW (ref 1.005–1.030)

## 2017-10-20 LAB — CBC WITH DIFFERENTIAL/PLATELET
BASOS PCT: 0 %
Basophils Absolute: 0 10*3/uL (ref 0.0–0.1)
Eosinophils Absolute: 0.1 10*3/uL (ref 0.0–0.7)
Eosinophils Relative: 4 %
HEMATOCRIT: 35.1 % — AB (ref 36.0–46.0)
Hemoglobin: 11.9 g/dL — ABNORMAL LOW (ref 12.0–15.0)
LYMPHS PCT: 38 %
Lymphs Abs: 1.5 10*3/uL (ref 0.7–4.0)
MCH: 28.7 pg (ref 26.0–34.0)
MCHC: 33.9 g/dL (ref 30.0–36.0)
MCV: 84.6 fL (ref 78.0–100.0)
MONO ABS: 0.8 10*3/uL (ref 0.1–1.0)
MONOS PCT: 19 %
Neutro Abs: 1.6 10*3/uL — ABNORMAL LOW (ref 1.7–7.7)
Neutrophils Relative %: 39 %
Platelets: 205 10*3/uL (ref 150–400)
RBC: 4.15 MIL/uL (ref 3.87–5.11)
RDW: 12.9 % (ref 11.5–15.5)
WBC: 4 10*3/uL (ref 4.0–10.5)

## 2017-10-20 LAB — COMPREHENSIVE METABOLIC PANEL
ALT: 68 U/L — ABNORMAL HIGH (ref 14–54)
ANION GAP: 6 (ref 5–15)
AST: 55 U/L — ABNORMAL HIGH (ref 15–41)
Albumin: 3.3 g/dL — ABNORMAL LOW (ref 3.5–5.0)
Alkaline Phosphatase: 79 U/L (ref 38–126)
BUN: 10 mg/dL (ref 6–20)
CO2: 26 mmol/L (ref 22–32)
Calcium: 9.4 mg/dL (ref 8.9–10.3)
Chloride: 105 mmol/L (ref 101–111)
Creatinine, Ser: 0.44 mg/dL (ref 0.44–1.00)
GFR calc Af Amer: >60 mL/min (ref >60)
Glucose, Bld: 138 mg/dL — ABNORMAL HIGH (ref 65–99)
Potassium: 3.4 mmol/L — ABNORMAL LOW (ref 3.5–5.1)
Sodium: 137 mmol/L (ref 135–145)
Total Bilirubin: 0.4 mg/dL (ref 0.3–1.2)
Total Protein: 6.2 g/dL — ABNORMAL LOW (ref 6.5–8.1)

## 2017-10-20 LAB — LIPASE, BLOOD: LIPASE: 23 U/L (ref 11–51)

## 2017-10-20 MED ORDER — METOPROLOL TARTRATE 50 MG PO TABS: 50.0000 mg | ORAL_TABLET | Freq: Two times a day (BID) | ORAL | 0 refills | Status: DC

## 2017-10-20 MED ORDER — HYDROXYZINE HCL 25 MG PO TABS
25.0000 mg | ORAL_TABLET | Freq: Once | ORAL | Status: AC
Start: 2017-10-20 — End: 2017-10-20
  Administered 2017-10-20: 25 mg via ORAL

## 2017-10-20 MED ORDER — HYDROXYZINE HCL 25 MG PO TABS: 25.0000 mg | ORAL_TABLET | Freq: Four times a day (QID) | ORAL | 0 refills | Status: DC

## 2017-10-20 MED ORDER — HYDROXYZINE HCL 10 MG PO TABS
10.0000 mg | ORAL_TABLET | Freq: Once | ORAL | Status: DC
  Filled 2017-10-20: qty 1

## 2017-10-20 MED ORDER — SODIUM CHLORIDE 0.9 % IV BOLUS (SEPSIS)
1000.0000 mL | Freq: Once | INTRAVENOUS | Status: AC
  Administered 2017-10-20: 1000 mL via INTRAVENOUS

## 2017-10-20 MED ORDER — METOPROLOL TARTRATE 50 MG PO TABS
25.0000 mg | ORAL_TABLET | Freq: Once | ORAL | Status: AC
Start: 2017-10-20 — End: 2017-10-20
  Administered 2017-10-20: 25 mg via ORAL
  Filled 2017-10-20: qty 1

## 2017-10-20 MED ORDER — METHIMAZOLE 10 MG PO TABS: 30.0000 mg | ORAL_TABLET | Freq: Every day | ORAL | 0 refills | Status: DC

## 2017-10-20 MED ORDER — HYDROXYZINE HCL 25 MG PO TABS
ORAL_TABLET | ORAL | Status: AC
  Filled 2017-10-20: qty 1

## 2017-10-20 MED FILL — METOPROLOL TARTRATE 50 MG T: 50 | 30 days supply | Qty: 60 | Fill #0

## 2017-10-20 MED FILL — methIMAzole 10 MG TABS: 10 | 30 days supply | Qty: 90 | Fill #0

## 2017-10-20 MED FILL — HYDROCHLOROTHIAZIDE 25 MG T: 25 | 3 days supply | Qty: 12 | Fill #0

## 2017-10-20 NOTE — ED Notes (Signed)
Patient transported to X-ray 

## 2017-10-20 NOTE — ED Provider Notes (Signed)
Emergency Department Provider Note   I have reviewed the triage vital signs and the nursing notes.   HISTORY  Chief Complaint Rash and Conjunctivitis  HPI and ROS conducted with audio Angie White interpreter.   HPI Angie White is a 42 y.o. female with PMH of hyperthyroidism presents to the emergency department for evaluation of continued itching, or eye redness, headaches, and upper respiratory infection symptoms including cough and runny nose.  Patient was admitted last month for thyrotoxicosis and discharged home with metoprolol.  She had follow-up with Dr. Sharl Ma and has been compliant with medication. She reports a subjective fever last night. No nausea, vomiting, or diarrhea. No pain.  No sick contacts.  The patient recently immigrated from [place] in early 2018.    Past Medical History:  Diagnosis Date  . Hyperthyroidism   . SIRS (systemic inflammatory response syndrome) [facility])     Patient Active Problem List   Diagnosis Date Noted  . Thyrotoxicosis 10/02/2017  . SIRS (systemic inflammatory response syndrome) (HCC) 10/02/2017  . Elevated LFTs 10/02/2017    History reviewed. No pertinent surgical history.  Current Outpatient Rx  . Order #: [id number] Class: Historical Med  . Order #: [id number] Class: Historical Med  . Order #: [id number] Class: Print  . Order #: [id number] Class: Print  . Order #: [id number] Class: Print  . Order #: [id number] Class: Print    Allergies Patient has no known allergies.  Family History  Problem Relation Age of Onset  . Diabetes Mellitus II Neg Hx     Social History Social History   Tobacco Use  . Smoking status: Never Smoker  . Smokeless tobacco: Never Used  Substance Use Topics  . Alcohol use: No    Frequency: Never  . Drug use: No    Review of Systems  Constitutional: No fever/chills. Positive total body itching.  Eyes: No visual changes. ENT: No sore throat. Cardiovascular: Denies chest pain. Respiratory: Denies shortness of breath.  Positive cough.  Gastrointestinal: No abdominal pain.  No nausea, no vomiting.  No diarrhea.  No constipation. Genitourinary: Negative for dysuria. Musculoskeletal: Negative for back pain. Skin: Negative for rash. Neurological: Negative for focal weakness or numbness. Positive HA.   10-point ROS otherwise negative.  ____________________________________________   PHYSICAL EXAM:  VITAL SIGNS: ED Triage Vitals [10/20/17 0908]  Enc Vitals Group     BP (!) 144/86     Pulse Rate (!) 112     Resp 18     Temp 97.8 F (36.6 C)     Temp Source Oral     SpO2 98 %     Weight 117 lb (53.1 kg)     Height 4\' 8"  (1.422 m)   Constitutional: Alert and oriented. Well appearing and in no acute distress. Eyes: Conjunctivae are injected.   Head: Atraumatic. Nose: No congestion/rhinnorhea. Mouth/Throat: Mucous membranes are moist. Oropharynx non-erythematous. Neck: No stridor.   Cardiovascular: Tachycardia. Good peripheral circulation. Grossly normal heart sounds.   Respiratory: Normal respiratory effort.  No retractions. Lungs CTAB. Gastrointestinal: Soft and nontender. No distention.  Musculoskeletal: No lower extremity tenderness nor edema. No gross deformities of extremities. Neurologic:  Normal speech and language. No gross focal neurologic deficits are appreciated.  Skin:  Skin is warm, dry and intact. No rash noted.   ____________________________________________   LABS (all labs ordered are listed, but only abnormal results are displayed)  Labs Reviewed  URINALYSIS, ROUTINE W REFLEX MICROSCOPIC - Abnormal; Notable for the following components:      Result Value  Specific Gravity, Urine <1.005 (*)    Glucose, UA 100 (*)    All other components within normal limits  CBC WITH DIFFERENTIAL/PLATELET - Abnormal; Notable for the following components:   Hemoglobin 11.9 (*)    HCT 35.1 (*)    Neutro Abs 1.6 (*)    All other components within normal limits  COMPREHENSIVE METABOLIC  PANEL - Abnormal; Notable for the following components:   Potassium 3.4 (*)    Glucose, Bld 138 (*)    Total Protein 6.2 (*)    Albumin 3.3 (*)    AST 55 (*)    ALT 68 (*)    All other components within normal limits  LIPASE, BLOOD  CBC WITH DIFFERENTIAL/PLATELET   ____________________________________________  RADIOLOGY  Dg Chest 2 View  Result Date: 10/20/2017 CLINICAL DATA:  Cough. EXAM: CHEST  2 VIEW COMPARISON:  None. FINDINGS: The heart size and mediastinal contours are within normal limits. Both lungs are clear. No pneumothorax or pleural effusion is noted. The visualized skeletal structures are unremarkable. IMPRESSION: No active cardiopulmonary disease. Electronically Signed   By: Lupita RaiderJames  Green Jr, M.D.   On: 10/20/2017 10:29    ____________________________________________   PROCEDURES  Procedure(s) performed:   Procedures  None ____________________________________________   INITIAL IMPRESSION / ASSESSMENT AND PLAN / ED COURSE  Pertinent labs & imaging results that were available during my care of the patient were reviewed by me and considered in my medical decision making (see chart for details).  Patient presents to the emergency department with return of symptoms after recent admission for mild thyrotoxicosis.  He was managed on beta-blockers and referred to Endocrinology, Dr. Sharl MaKerr. Plan to try and reach Dr. Sharl MaKerr by phone. No abdominal tenderness. Tachycardia likely due to thyroid disease. Plan for repeat labs, UA, and IVF.   10:05 AM Spoke with Dr. Sharl MaKerr on the phone. Patient is supposed to be on methimazole 30 mg daily, betablocker, and tapering prednisone. He will see her on the 18th of this month for blood testing and cortisol levels to evaluate for adrenal insufficiency. Patient does not have methimazole filled at the bedside and family does not know about this med. Plan to start that here. Dr. Sharl MaKerr also advised increasing betablocker if tolerated for symptom  control. With Cortisol testing to be done later this month I will not adjust steroid dosing.   Adjusted medications with increase in Metoprolol and adding Methimazole. Discussed dosing changes in detail with the patient and family using the interpreter. Discussed f/u plan and return precautions in detail.   At this time, I do not feel there is any life-threatening condition present. I have reviewed and discussed all results (EKG, imaging, lab, urine as appropriate), exam findings with patient. I have reviewed nursing notes and appropriate previous records.  I feel the patient is safe to be discharged home without further emergent workup. Discussed usual and customary return precautions. Patient and family (if present) verbalize understanding and are comfortable with this plan.  Patient will follow-up with their primary care provider. If they do not have a primary care provider, information for follow-up has been provided to them. All questions have been answered.  ____________________________________________  FINAL CLINICAL IMPRESSION(S) / ED DIAGNOSES  Final diagnoses:  Hyperthyroidism  Itching     MEDICATIONS GIVEN DURING THIS VISIT:  Medications  sodium chloride 0.9 % bolus 1,000 mL (0 mLs Intravenous Stopped 10/20/17 1058)  metoprolol tartrate (LOPRESSOR) tablet 25 mg (25 mg Oral Given 10/20/17 1019)  hydrOXYzine (ATARAX/VISTARIL) tablet  25 mg (25 mg Oral Given 10/20/17 1019)     NEW OUTPATIENT MEDICATIONS STARTED DURING THIS VISIT:  Metoprolol 50 mg BID and Methimazole 30 mg daily   Note:  This document was prepared using Dragon voice recognition software and may include unintentional dictation errors.  Alona Bene, MD Emergency Medicine    Taivon Haroon, Arlyss Repress, MD 10/20/17 [phone]

## 2017-10-20 NOTE — ED Notes (Signed)
Pt speaks Angie BraunKaren. Daughter translating.

## 2017-10-20 NOTE — ED Triage Notes (Addendum)
Pt from [place].  Pt has been seen recently here for hyperthyroidism.  Pt placed on prednisone daily by endocrinologist.  Pt is continuing to have generalized itching.  Pt also has red, inflamed bilateral eyes that appears similar to conjunctivitis.    Pt also c/o runny nose, congestion and cough.

## 2017-10-20 NOTE — Discharge Instructions (Signed)
Take the new medication, Methimazole and follow up with Dr. Sharl MaKerr.

## 2017-10-20 NOTE — ED Notes (Signed)
Up to BR, gait steady 

## 2017-10-20 NOTE — ED Notes (Signed)
ED Provider at bedside. 

## 2017-11-30 MED FILL — methIMAzole 10 MG TABS: 10 | 30 days supply | Qty: 150 | Fill #0

## 2017-12-25 ENCOUNTER — Other Ambulatory Visit (HOSPITAL_COMMUNITY): Payer: Self-pay | Admitting: Internal Medicine

## 2017-12-25 DIAGNOSIS — E0500 Thyrotoxicosis with diffuse goiter without thyrotoxic crisis or storm: Secondary | ICD-10-CM

## 2017-12-25 DIAGNOSIS — E0590 Thyrotoxicosis, unspecified without thyrotoxic crisis or storm: Secondary | ICD-10-CM

## 2017-12-26 ENCOUNTER — Other Ambulatory Visit (HOSPITAL_COMMUNITY): Payer: Self-pay | Admitting: Internal Medicine

## 2017-12-26 DIAGNOSIS — E0500 Thyrotoxicosis with diffuse goiter without thyrotoxic crisis or storm: Secondary | ICD-10-CM

## 2018-01-02 ENCOUNTER — Encounter (HOSPITAL_COMMUNITY)
Admission: RE | Admit: 2018-01-02 | Discharge: 2018-01-02 | Disposition: A | Discharge status: Home / Self Care | Payer: Medicaid Other | Source: Ambulatory Visit | Attending: Internal Medicine | Admitting: Internal Medicine

## 2018-01-02 ENCOUNTER — Encounter (HOSPITAL_COMMUNITY): Payer: Self-pay | Admitting: Radiology

## 2018-01-02 DIAGNOSIS — E0590 Thyrotoxicosis, unspecified without thyrotoxic crisis or storm: Secondary | ICD-10-CM | POA: Insufficient documentation

## 2018-01-02 DIAGNOSIS — E0500 Thyrotoxicosis with diffuse goiter without thyrotoxic crisis or storm: Secondary | ICD-10-CM

## 2018-01-02 MED ORDER — SODIUM IODIDE I 131 CAPSULE
10.4000 | Freq: Once | INTRAVENOUS | Status: AC | PRN
  Administered 2018-01-02: 10.4 via ORAL

## 2018-01-03 ENCOUNTER — Encounter (HOSPITAL_COMMUNITY)
Admission: RE | Admit: 2018-01-03 | Discharge: 2018-01-03 | Disposition: A | Discharge status: Home / Self Care | Payer: Medicaid Other | Source: Ambulatory Visit | Attending: Internal Medicine | Admitting: Internal Medicine

## 2018-01-03 DIAGNOSIS — E0500 Thyrotoxicosis with diffuse goiter without thyrotoxic crisis or storm: Secondary | ICD-10-CM | POA: Insufficient documentation

## 2018-01-03 DIAGNOSIS — E0590 Thyrotoxicosis, unspecified without thyrotoxic crisis or storm: Secondary | ICD-10-CM | POA: Insufficient documentation

## 2018-01-03 MED ORDER — SODIUM PERTECHNETATE TC 99M INJECTION
10.0000 | Freq: Once | INTRAVENOUS | Status: AC | PRN
  Administered 2018-01-03: 10 via INTRAVENOUS

## 2018-01-12 ENCOUNTER — Encounter (HOSPITAL_COMMUNITY): Payer: Self-pay

## 2018-01-12 ENCOUNTER — Encounter (HOSPITAL_COMMUNITY)
Admission: RE | Admit: 2018-01-12 | Discharge: 2018-01-12 | Disposition: A | Discharge status: Home / Self Care | Payer: Medicaid Other | Source: Ambulatory Visit | Attending: Internal Medicine | Admitting: Internal Medicine

## 2018-04-10 ENCOUNTER — Observation Stay (HOSPITAL_BASED_OUTPATIENT_CLINIC_OR_DEPARTMENT_OTHER)
Admission: EM | Admit: 2018-04-10 | Discharge: 2018-04-11 | Disposition: A | Discharge status: Home / Self Care | Payer: Medicaid Other | Attending: Internal Medicine | Admitting: Internal Medicine

## 2018-04-10 ENCOUNTER — Encounter (HOSPITAL_BASED_OUTPATIENT_CLINIC_OR_DEPARTMENT_OTHER): Payer: Self-pay | Admitting: Emergency Medicine

## 2018-04-10 ENCOUNTER — Other Ambulatory Visit: Payer: Self-pay

## 2018-04-10 DIAGNOSIS — R51 Headache: Secondary | ICD-10-CM | POA: Insufficient documentation

## 2018-04-10 DIAGNOSIS — R945 Abnormal results of liver function studies: Secondary | ICD-10-CM

## 2018-04-10 DIAGNOSIS — Y92009 Unspecified place in unspecified non-institutional (private) residence as the place of occurrence of the external cause: Secondary | ICD-10-CM | POA: Insufficient documentation

## 2018-04-10 DIAGNOSIS — Z79899 Other long term (current) drug therapy: Secondary | ICD-10-CM | POA: Insufficient documentation

## 2018-04-10 DIAGNOSIS — Z91128 Patient's intentional underdosing of medication regimen for other reason: Secondary | ICD-10-CM | POA: Insufficient documentation

## 2018-04-10 DIAGNOSIS — T382X6A Underdosing of antithyroid drugs, initial encounter: Secondary | ICD-10-CM | POA: Insufficient documentation

## 2018-04-10 DIAGNOSIS — E0590 Thyrotoxicosis, unspecified without thyrotoxic crisis or storm: Principal | ICD-10-CM | POA: Insufficient documentation

## 2018-04-10 DIAGNOSIS — Z7952 Long term (current) use of systemic steroids: Secondary | ICD-10-CM | POA: Insufficient documentation

## 2018-04-10 DIAGNOSIS — R519 Headache, unspecified: Secondary | ICD-10-CM | POA: Diagnosis present

## 2018-04-10 DIAGNOSIS — L299 Pruritus, unspecified: Secondary | ICD-10-CM | POA: Insufficient documentation

## 2018-04-10 DIAGNOSIS — R7989 Other specified abnormal findings of blood chemistry: Secondary | ICD-10-CM | POA: Insufficient documentation

## 2018-04-10 DIAGNOSIS — Z9114 Patient's other noncompliance with medication regimen: Secondary | ICD-10-CM | POA: Insufficient documentation

## 2018-04-10 DIAGNOSIS — N390 Urinary tract infection, site not specified: Secondary | ICD-10-CM | POA: Insufficient documentation

## 2018-04-10 LAB — COMPREHENSIVE METABOLIC PANEL
ALBUMIN: 3.5 g/dL (ref 3.5–5.0)
ALK PHOS: 146 U/L — AB (ref 38–126)
ALT: 98 U/L — ABNORMAL HIGH (ref 0–44)
AST: 78 U/L — ABNORMAL HIGH (ref 15–41)
Anion gap: 8 (ref 5–15)
BILIRUBIN TOTAL: 0.5 mg/dL (ref 0.3–1.2)
BUN: 9 mg/dL (ref 6–20)
CALCIUM: 10.8 mg/dL — AB (ref 8.9–10.3)
CO2: 27 mmol/L (ref 22–32)
CREATININE: 0.38 mg/dL — AB (ref 0.44–1.00)
Chloride: 102 mmol/L (ref 98–111)
GFR calc non Af Amer: >60 mL/min (ref >60)
GLUCOSE: 207 mg/dL — AB (ref 70–99)
Potassium: 3.1 mmol/L — ABNORMAL LOW (ref 3.5–5.1)
Sodium: 137 mmol/L (ref 135–145)
TOTAL PROTEIN: 7.2 g/dL (ref 6.5–8.1)

## 2018-04-10 LAB — URINALYSIS, ROUTINE W REFLEX MICROSCOPIC
BILIRUBIN URINE: NEGATIVE
Glucose, UA: 250 mg/dL — AB
Ketones, ur: NEGATIVE mg/dL
Nitrite: POSITIVE — AB
PH: 7 (ref 5.0–8.0)
Protein, ur: NEGATIVE mg/dL
SPECIFIC GRAVITY, URINE: 1.01 (ref 1.005–1.030)

## 2018-04-10 LAB — CBC WITH DIFFERENTIAL/PLATELET
BASOS ABS: 0 10*3/uL (ref 0.0–0.1)
BASOS PCT: 0 %
EOS ABS: 0.1 10*3/uL (ref 0.0–0.7)
Eosinophils Relative: 3 %
HCT: 40.2 % (ref 36.0–46.0)
HEMOGLOBIN: 14.3 g/dL (ref 12.0–15.0)
Lymphocytes Relative: 36 %
Lymphs Abs: 1.6 10*3/uL (ref 0.7–4.0)
MCH: 29.1 pg (ref 26.0–34.0)
MCHC: 35.6 g/dL (ref 30.0–36.0)
MCV: 81.7 fL (ref 78.0–100.0)
Monocytes Absolute: 0.8 10*3/uL (ref 0.1–1.0)
Monocytes Relative: 17 %
NEUTROS PCT: 44 %
Neutro Abs: 2 10*3/uL (ref 1.7–7.7)
Platelets: 158 10*3/uL (ref 150–400)
RBC: 4.92 MIL/uL (ref 3.87–5.11)
RDW: 12.8 % (ref 11.5–15.5)
WBC: 4.5 10*3/uL (ref 4.0–10.5)

## 2018-04-10 LAB — URINALYSIS, MICROSCOPIC (REFLEX)

## 2018-04-10 LAB — TSH

## 2018-04-10 LAB — I-STAT CG4 LACTIC ACID, ED: Lactic Acid, Venous: 1.26 mmol/L (ref 0.5–1.9)

## 2018-04-10 LAB — T4, FREE: FREE T4: 5.41 ng/dL — AB (ref 0.82–1.77)

## 2018-04-10 MED ORDER — METHIMAZOLE 10 MG PO TABS
30.0000 mg | ORAL_TABLET | Freq: Every day | ORAL | Status: DC
  Administered 2018-04-10 – 2018-04-11 (×2): 30 mg via ORAL
  Filled 2018-04-10 (×2): qty 3

## 2018-04-10 MED ORDER — POTASSIUM CHLORIDE CRYS ER 20 MEQ PO TBCR
40.0000 meq | EXTENDED_RELEASE_TABLET | Freq: Once | ORAL | Status: AC
  Administered 2018-04-10: 40 meq via ORAL
  Filled 2018-04-10: qty 2

## 2018-04-10 MED ORDER — SODIUM CHLORIDE 0.9 % IV SOLN
1.0000 g | Freq: Once | INTRAVENOUS | Status: AC
  Administered 2018-04-10: 1 g via INTRAVENOUS
  Filled 2018-04-10: qty 10

## 2018-04-10 MED ORDER — ACETAMINOPHEN 325 MG PO TABS
650.0000 mg | ORAL_TABLET | Freq: Four times a day (QID) | ORAL | Status: DC | PRN
Start: 2018-04-10 — End: 2018-04-11
  Administered 2018-04-10: 650 mg via ORAL
  Filled 2018-04-10: qty 2

## 2018-04-10 MED ORDER — SODIUM CHLORIDE 0.9 % IV SOLN: Freq: Once | INTRAVENOUS | Status: DC

## 2018-04-10 MED ORDER — ACETAMINOPHEN 650 MG RE SUPP
650.0000 mg | Freq: Four times a day (QID) | RECTAL | Status: DC | PRN
Start: 2018-04-10 — End: 2018-04-11

## 2018-04-10 MED ORDER — METOPROLOL TARTRATE 50 MG PO TABS
50.0000 mg | ORAL_TABLET | Freq: Two times a day (BID) | ORAL | Status: DC
  Administered 2018-04-10 – 2018-04-11 (×2): 50 mg via ORAL
  Filled 2018-04-10 (×2): qty 1

## 2018-04-10 MED ORDER — SODIUM CHLORIDE 0.9 % IV BOLUS
1000.0000 mL | Freq: Once | INTRAVENOUS | Status: AC
  Administered 2018-04-10: 1000 mL via INTRAVENOUS

## 2018-04-10 MED ORDER — ONDANSETRON HCL 4 MG PO TABS: 4.0000 mg | ORAL_TABLET | Freq: Four times a day (QID) | ORAL | Status: DC | PRN

## 2018-04-10 MED ORDER — ONDANSETRON HCL 4 MG/2ML IJ SOLN: 4.0000 mg | Freq: Four times a day (QID) | INTRAMUSCULAR | Status: DC | PRN

## 2018-04-10 MED ORDER — ENOXAPARIN SODIUM 40 MG/0.4ML ~~LOC~~ SOLN
40.0000 mg | Freq: Every day | SUBCUTANEOUS | Status: DC
  Administered 2018-04-10: 40 mg via SUBCUTANEOUS
  Filled 2018-04-10: qty 0.4

## 2018-04-10 MED ORDER — SODIUM CHLORIDE 0.9 % IV SOLN
1.0000 g | INTRAVENOUS | Status: DC
  Administered 2018-04-11: 1 g via INTRAVENOUS
  Filled 2018-04-10: qty 1

## 2018-04-10 NOTE — ED Notes (Signed)
Pt walked to BR with steady gate-family at bedside

## 2018-04-10 NOTE — ED Notes (Signed)
Interpretor at bedside with EDP to discuss admission and plan of care

## 2018-04-10 NOTE — ED Provider Notes (Addendum)
[facility]ERGENCY DEPARTMENT Provider Note  CSN: [id number] Arrival date & time: 04/10/18  0944  History   Chief Complaint Chief Complaint  Patient presents with  . rash and tired   HPI Angie White medical interpreter was used for this encounter.   Angie White is Angie 42 y.o. female with Angie medical history of hyperthyroidism who presented to the ED for rash x2 months. Patient describes itching that is worse at night. She denies any new allergens, exposures, changes in detergent/household/body products or recent travel. Denies fever, chills, areas of redness, lesions or swelling.   Patient also complains of fatigue and decreased appetite x4 days. Denies recent sick contacts or upper respiratory symptoms. Associated symptoms: headache and constipation (which patient states it is normal to have BM every 1-2 days). Patient has tried topical skin cream prior to coming to the ED for the rash, but nothing for the fatigue or decreased appetite. Patient states does she not take any Rx medications despite having Rx for methimazole and metoprolol as recent as 10/2017 documented.  Additional history obtained by medical chart. Patient has had past hospitalization for thyrotoxicosis in 09/2017 with follow-up to an endocrindologist at discharge. There are no documented visits to an endocrinologist. Patient states she did not go to that visit because she had to take one of her children to Angie medical appointment and thinks she is supposed to go to endocrinology in 05/2018.  Past Medical History:  Diagnosis Date  . Hyperthyroidism   . SIRS (systemic inflammatory response syndrome) [facility])     Patient Active Problem List   Diagnosis Date Noted  . Hyperthyroidism 04/10/2018  . Thyrotoxicosis 10/02/2017  . SIRS (systemic inflammatory response syndrome) ([place]) 10/02/2017  . Elevated LFTs 10/02/2017    History reviewed. No pertinent surgical history.   OB History   None     Home Medications    Prior  to Admission medications   Medication Sig Start Date End Date Taking? Authorizing Provider  atorvastatin (LIPITOR) 20 MG tablet Take 20 mg by mouth daily. 06/26/17 06/26/18  [provider]  hydrALAZINE (APRESOLINE) 25 MG tablet Take 25 mg by mouth 3 (three) times daily.    [provider]  hydrOXYzine (ATARAX/VISTARIL) 25 MG tablet Take 1 tablet (25 mg total) by mouth every 6 (six) hours. Patient not taking: Reported on 04/10/2018 10/20/17   Long, Wonda Olds, MD  methimazole (TAPAZOLE) 10 MG tablet Take 3 tablets (30 mg total) by mouth daily. 10/20/17 11/19/17  Long, Wonda Olds, MD  metoprolol tartrate (LOPRESSOR) 50 MG tablet Take 1 tablet (50 mg total) by mouth 2 (two) times daily. 10/20/17 11/19/17  Long, Wonda Olds, MD  predniSONE (DELTASONE) 5 MG tablet Take 5 mg by mouth daily with breakfast.    [provider]  Skin Protectants, Misc. (EUCERIN) cream Apply topically as needed for dry skin. 09/26/17   Etta Quill, NP    Family History Family History  Problem Relation Age of Onset  . Diabetes Mellitus II Neg Hx     Social History Social History   Tobacco Use  . Smoking status: Never Smoker  . Smokeless tobacco: Never Used  Substance Use Topics  . Alcohol use: No    Frequency: Never  . Drug use: No     Allergies   Patient has no known allergies.   Review of Systems Review of Systems  Constitutional: Positive for appetite change and fatigue. Negative for activity change, chills, diaphoresis and fever.  HENT: Negative.  Eyes: Negative.   Respiratory: Negative.  Negative for cough, chest tightness, shortness of breath and wheezing.   Cardiovascular: Negative.  Negative for chest pain, palpitations and leg swelling.  Gastrointestinal: Positive for constipation. Negative for abdominal pain, diarrhea, nausea and vomiting.  Endocrine: Negative.   Genitourinary: Negative for decreased urine volume, difficulty urinating and dysuria.  Skin: Positive for pallor and  rash. Negative for color change.  Neurological: Positive for headaches. Negative for dizziness, syncope, speech difficulty, weakness, light-headedness and numbness.  Hematological: Negative.      Physical Exam Updated Vital Signs BP 127/67   Pulse (!) 117   Temp 98.8 F (37.1 C) (Oral)   Resp (!) 30   SpO2 99%   Physical Exam  Constitutional: She appears well-developed and well-nourished. She is cooperative. No distress.  HENT:  Mouth/Throat: Uvula is midline and oropharynx is clear and moist. Mucous membranes are dry. Dental caries present.  Eyes: Pupils are equal, round, and reactive to light. Conjunctivae, EOM and lids are normal.  Cardiovascular: Regular rhythm, normal heart sounds and intact distal pulses. Tachycardia present. Exam reveals no gallop and no friction rub.  No murmur heard. Pulses:      Dorsalis pedis pulses are 2+ on the right side, and 2+ on the left side.       Posterior tibial pulses are 2+ on the right side, and 2+ on the left side.  Pulmonary/Chest: Effort normal and breath sounds normal.  Abdominal: Soft. Normal appearance and bowel sounds are normal. There is no hepatomegaly. There is no tenderness.  Feet:  Right Foot:  Skin Integrity: Positive for dry skin. Negative for skin breakdown, erythema or warmth.  Left Foot:  Skin Integrity: Positive for dry skin. Negative for skin breakdown, erythema or warmth.  Neurological: She is alert.  Skin: Skin is warm, dry and intact. No rash noted. She is not diaphoretic.  No visible rash or signs of past rash or lesions. Excoriations on upper back from past scratching.  Nursing note and vitals reviewed.    ED Treatments / Results  Labs (all labs ordered are listed, but only abnormal results are displayed) Labs Reviewed  URINALYSIS, ROUTINE W REFLEX MICROSCOPIC - Abnormal; Notable for the following components:      Result Value   APPearance HAZY (*)    Glucose, UA 250 (*)    Hgb urine dipstick TRACE (*)     Nitrite POSITIVE (*)    Leukocytes, UA SMALL (*)    All other components within normal limits  COMPREHENSIVE METABOLIC PANEL - Abnormal; Notable for the following components:   Potassium 3.1 (*)    Glucose, Bld 207 (*)    Creatinine, Ser 0.38 (*)    Calcium 10.8 (*)    AST 78 (*)    ALT 98 (*)    Alkaline Phosphatase 146 (*)    All other components within normal limits  TSH - Abnormal; Notable for the following components:   TSH <0.010 (*)    All other components within normal limits  T4, FREE - Abnormal; Notable for the following components:   Free T4 5.41 (*)    All other components within normal limits  URINALYSIS, MICROSCOPIC (REFLEX) - Abnormal; Notable for the following components:   Bacteria, UA MANY (*)    All other components within normal limits  CBC WITH DIFFERENTIAL/PLATELET  I-STAT CG4 LACTIC ACID, ED    EKG None  Radiology No results found.  Procedures Procedures (including critical care time)  Medications Ordered  in ED Medications  sodium chloride 0.9 % bolus 1,000 mL (0 mLs Intravenous Stopped 04/10/18 1200)  cefTRIAXone (ROCEPHIN) 1 g in sodium chloride 0.9 % 100 mL IVPB (0 g Intravenous Stopped 04/10/18 1301)  sodium chloride 0.9 % bolus 1,000 mL (0 mLs Intravenous Stopped 04/10/18 1637)   Initial Impression / Assessment and Plan / ED Course  Triage vital signs and the nursing notes have been reviewed.  Pertinent labs & imaging results that were available during care of the patient were reviewed and considered in medical decision making (see chart for details).  Patient presents in no acute distress and well appearing. However, she is tachycardic which is appreciated on physical exam. Despite endorsing rash, there is no visible or palpable rash on physical exam. Given patient's complaints and medical history, there is concern for hyperthyroidism/thyrotoxicosis. However, appropriate labs have been ordered to assist in ruling out hepatic or renal etiology as  well.  Clinical Course as of Apr 11 1807  Tue Apr 10, 2018  1114 EKG showed sinus tachycardia. No ST changes or signs of acute ischemia or infarct. 1L NS bolus administered.   [GM]  4010 UA consistent with UTI. IV Rocephin 1g x1 given in the ED. Lactic acid normal. Patient remains tachycardic and tachypneic despite administration of IV fluids. However, she does not appear ill or in distress. Mildly elevated LFTs and alk phos.   [GM]  2725 Patient reassessed and is sitting comfortably in the bed talking with her son who is bedside. Patient continues to be tachypneic and tachycardic. Another 1L NS has been ordered.   [GM]  3664 Hyperthyroid confirmed with TSH < 0.010 and T4 5.41. Consult placed with [facility] for admission.   [GM]  4034 Using Angie Hayden interpreter, admission and transport process was explained to patient.   [GM]    Clinical Course User Index [GM] Johnross Nabozny, Jonelle Sports, PA-C   Hyperthyroid confirmed with labs. While patient is well appearing, she remains tachypneic and tachycardic in the ED. Patient would benefit greatly from inpatient hospitalization for acute management of hyperthyroidism as she has been non-compliant with medications and is at risk for thyroid storm or further complications if discharged. Consult placed with [facility] who will admit patient.  Final Clinical Impressions(s) / ED Diagnoses  1. Hyperthyroid. Possible thyrotoxicosis. Inpatient hospitalization required. Patient will be admitted with [facility].  2. Urinary Tract Infection. Treated with IV Rocephin 1g x1 in the ED.  Dispo: Admit. Consult placed with [facility] who will admit patient.  Final diagnoses:  Hyperthyroidism    ED Discharge Orders    None        Romie Jumper, PA-C 04/10/18 1742    [address] 04/10/18 1808    Macarthur Critchley, MD 05/22/18 1014

## 2018-04-10 NOTE — ED Notes (Signed)
ED Provider at bedside. 

## 2018-04-10 NOTE — ED Notes (Signed)
[facility] arrived to transport pt to [facility].  

## 2018-04-10 NOTE — ED Notes (Signed)
Walked to [facility] with no assistance- steady gate

## 2018-04-10 NOTE — H&P (Signed)
History and Physical    Angie White GTX:[id number] DOB: [birth year] DOA: 04/10/2018  PCP: [facility]  Patient coming from: Home  I have personally briefly reviewed patient's old medical records in [place]  Chief Complaint: Fever, headache  HPI: Angie White is a 42 y.o. female with medical history significant of Hyperthyroidism.  Patient admitted for thyrotoxicosis back in Dec, but stopped taking thyroid meds after discharge.  Initially she thought it was causing a rash, but now she believes it may be hormonal contraceptives causing rash (rash started after depo shot, then occurred again after nexplanon).  Patient presents to the ED at [facility] today with c/o fever and headache.  Symptoms onset this morning.  Also has fatigue and decreased appetite over past 4 days.  Has endocrinology visit in [facility] [place].  Interpreter used.   ED Course: Tachy in 110s-130s.  UA demonstrates UTI.  WBC 99.4.  K 3.1.  Calcium 10.8.  BGL 207.  TSH undetectable.  FT4 5.41   Review of Systems: As per HPI otherwise 10 point review of systems negative.   Past Medical History:  Diagnosis Date  . Hyperthyroidism   . SIRS (systemic inflammatory response syndrome) ([facility])     History reviewed. No pertinent surgical history.   reports that she has never smoked. She has never used smokeless tobacco. She reports that she does not drink alcohol or use drugs.  No Known Allergies  Family History  Problem Relation Age of Onset  . Diabetes Mellitus II Neg Hx      Prior to Admission medications   Medication Sig Start Date End Date Taking? Authorizing Provider  atorvastatin (LIPITOR) 20 MG tablet Take 20 mg by mouth daily. 06/26/17 06/26/18  [provider]  hydrALAZINE (APRESOLINE) 25 MG tablet Take 25 mg by mouth 3 (three) times daily.    [provider]  hydrOXYzine (ATARAX/VISTARIL) 25 MG tablet Take 1 tablet (25 mg total) by mouth every 6 (six) hours. Patient not  taking: Reported on 04/10/2018 10/20/17   Long, Wonda Olds, MD  methimazole (TAPAZOLE) 10 MG tablet Take 3 tablets (30 mg total) by mouth daily. 10/20/17 11/19/17  Long, Wonda Olds, MD  metoprolol tartrate (LOPRESSOR) 50 MG tablet Take 1 tablet (50 mg total) by mouth 2 (two) times daily. 10/20/17 11/19/17  Long, Wonda Olds, MD  predniSONE (DELTASONE) 5 MG tablet Take 5 mg by mouth daily with breakfast.    [provider]  Skin Protectants, Misc. (EUCERIN) cream Apply topically as needed for dry skin. 09/26/17   Etta Quill, NP    Physical Exam: Vitals:   04/10/18 1630 04/10/18 1830 04/10/18 1834 04/10/18 1943  BP: 127/67 140/77  139/86  Pulse:  (!) 124  (!) 133  Resp: (!) 30 (!) 27  18  Temp:   98.7 F (37.1 C) 99.4 F (37.4 C)  TempSrc:   Oral   SpO2:  97%  98%    Constitutional: NAD, calm, comfortable Eyes: PERRL, lids and conjunctivae normal ENMT: Mucous membranes are moist. Posterior pharynx clear of any exudate or lesions.Normal dentition.  Neck: normal, supple, no masses, no thyromegaly Respiratory: clear to auscultation bilaterally, no wheezing, no crackles. Normal respiratory effort. No accessory muscle use.  Cardiovascular: Tachycardic Abdomen: no tenderness, no masses palpated. No hepatosplenomegaly. Bowel sounds positive.  Musculoskeletal: no clubbing / cyanosis. No joint deformity upper and lower extremities. Good ROM, no contractures. Normal muscle tone.  Skin: No rash on exam. Neurologic: CN 2-12 grossly intact.  Sensation intact, DTR normal. Strength 5/5 in all 4.  Psychiatric: Normal judgment and insight. Alert and oriented x 3. Normal mood.    Labs on Admission: I have personally reviewed following labs and imaging studies  CBC: Recent Labs  Lab 04/10/18 1036  WBC 4.5  NEUTROABS 2.0  HGB 14.3  HCT 40.2  MCV 81.7  PLT 883   Basic Metabolic Panel: Recent Labs  Lab 04/10/18 1036  NA 137  K 3.1*  CL 102  CO2 27  GLUCOSE 207*  BUN 9  CREATININE 0.38*    CALCIUM 10.8*   GFR: CrCl cannot be calculated (Unknown ideal weight.). Liver Function Tests: Recent Labs  Lab 04/10/18 1036  AST 78*  ALT 98*  ALKPHOS 146*  BILITOT 0.5  PROT 7.2  ALBUMIN 3.5   No results for input(s): LIPASE, AMYLASE in the last 168 hours. No results for input(s): AMMONIA in the last 168 hours. Coagulation Profile: No results for input(s): INR, PROTIME in the last 168 hours. Cardiac Enzymes: No results for input(s): CKTOTAL, CKMB, CKMBINDEX, TROPONINI in the last 168 hours. BNP (last 3 results) No results for input(s): PROBNP in the last 8760 hours. HbA1C: No results for input(s): HGBA1C in the last 72 hours. CBG: No results for input(s): GLUCAP in the last 168 hours. Lipid Profile: No results for input(s): CHOL, HDL, LDLCALC, TRIG, CHOLHDL, LDLDIRECT in the last 72 hours. Thyroid Function Tests: Recent Labs    04/10/18 1036  TSH <0.010*  FREET4 5.41*   Anemia Panel: No results for input(s): VITAMINB12, FOLATE, FERRITIN, TIBC, IRON, RETICCTPCT in the last 72 hours. Urine analysis:    Component Value Date/Time   COLORURINE YELLOW 04/10/2018 1110   APPEARANCEUR HAZY (A) 04/10/2018 1110   LABSPEC 1.010 04/10/2018 1110   PHURINE 7.0 04/10/2018 1110   GLUCOSEU 250 (A) 04/10/2018 1110   HGBUR TRACE (A) 04/10/2018 1110   BILIRUBINUR NEGATIVE 04/10/2018 1110   KETONESUR NEGATIVE 04/10/2018 1110   PROTEINUR NEGATIVE 04/10/2018 1110   NITRITE POSITIVE (A) 04/10/2018 1110   LEUKOCYTESUR SMALL (A) 04/10/2018 1110    Radiological Exams on Admission: No results found.  EKG: Independently reviewed.  Assessment/Plan Principal Problem:   Thyrotoxicosis Active Problems:   Elevated LFTs   Acute lower UTI   Headache    1. Thyrotoxicosis -  1. Resume metoprolol 2. Resume tapazole 3. Follow up with endocrinology: sounds like she already has appointment for mid month in [place]. 2. UTI - 1. Rocephin 2. Culture ordered 3. Headache - probably due to  UTI / fever from UTI 1. Tylenol 2. If that doesn't work then may try NSAID or decadron. 3. Will check ESR but not really having vision changes, temporal artery tenderness, etc.  Doubt GCA 4. Elevated LFTs - probably secondary to thyrotoxicosis 1. Repeat CMP in AM  DVT prophylaxis: Lovenox Code Status: Full Family Communication: Family at bedside Disposition Plan: Home after admit Consults called: None Admission status: Admit to inpatient   Etta Quill DO [facility] Pager [phone] Only works nights!  If 7AM-7PM, please contact the primary day team physician taking care of patient  [email or web address] Password [facility]  04/10/2018, 11:56 PM

## 2018-04-10 NOTE — ED Triage Notes (Signed)
Rash since April.  Tired and HA and less appetite x4 days.  Denies n/v. Reports constipation.

## 2018-04-11 LAB — COMPREHENSIVE METABOLIC PANEL
ALT: 75 U/L — AB (ref 0–44)
AST: 45 U/L — AB (ref 15–41)
Albumin: 3.2 g/dL — ABNORMAL LOW (ref 3.5–5.0)
Alkaline Phosphatase: 124 U/L (ref 38–126)
Anion gap: 9 (ref 5–15)
BUN: 11 mg/dL (ref 6–20)
CHLORIDE: 110 mmol/L (ref 98–111)
CO2: 24 mmol/L (ref 22–32)
CREATININE: 0.35 mg/dL — AB (ref 0.44–1.00)
Calcium: 10.9 mg/dL — ABNORMAL HIGH (ref 8.9–10.3)
GFR calc non Af Amer: >60 mL/min (ref >60)
Glucose, Bld: 146 mg/dL — ABNORMAL HIGH (ref 70–99)
Potassium: 3.1 mmol/L — ABNORMAL LOW (ref 3.5–5.1)
SODIUM: 143 mmol/L (ref 135–145)
Total Bilirubin: 0.7 mg/dL (ref 0.3–1.2)
Total Protein: 6.3 g/dL — ABNORMAL LOW (ref 6.5–8.1)

## 2018-04-11 LAB — SEDIMENTATION RATE: Sed Rate: 48 mm/hr — ABNORMAL HIGH (ref 0–22)

## 2018-04-11 MED ORDER — CEFPODOXIME PROXETIL 100 MG PO TABS: 100.0000 mg | ORAL_TABLET | Freq: Two times a day (BID) | ORAL | 0 refills | Status: AC

## 2018-04-11 MED ORDER — ENSURE ENLIVE PO LIQD: 237.0000 mL | Freq: Two times a day (BID) | ORAL | Status: DC

## 2018-04-11 MED ORDER — POTASSIUM CHLORIDE CRYS ER 20 MEQ PO TBCR
40.0000 meq | EXTENDED_RELEASE_TABLET | Freq: Once | ORAL | Status: AC
  Administered 2018-04-11: 40 meq via ORAL
  Filled 2018-04-11: qty 2

## 2018-04-11 MED ORDER — METHIMAZOLE 10 MG PO TABS: 30.0000 mg | ORAL_TABLET | Freq: Every day | ORAL | 0 refills | Status: DC

## 2018-04-11 NOTE — Discharge Summary (Signed)
Physician Discharge Summary  Angie White WUJ:[id number] DOB: 06/16/1976 DOA: 04/10/2018  PCP: [facility]  Admit date: 04/10/2018 Discharge date: 04/11/2018  Admitted From: Home  Disposition:  Home   Recommendations for Outpatient Follow-up:  1. Follow up with PCP in 1-2 weeks 2. Please obtain BMP/CBC in one week your next doctors visit.  3. Check TSH/free T4 in 4 weeks 4. Follow-up with endocrinology, Dr Sharl MaKerr in 2-4 weeks 5. Follow-up with outpatient GYN tomorrow, she already has an appointment. 6. Take methimazole as prescribed daily 7. Take Vantin orally as prescribed for urinary tract infection  Discharge Condition: Stable CODE STATUS: Full code Diet recommendation: Regular  Brief/Interim Summary: 42 year old female with history of hypothyroidism admitted back here in December for thyrotoxicosis and was discharged on methimazole.  After leaving from here at some point she stopped taking her medication as she thought it was not helping her much.  [facility] department with complaints of itching and feeling very weak.  There was no notable rash noted but she reported that it had started after having Nexplanon placed on February 07, 2018.  She has a follow-up appointment with her GYN tomorrow for the removal as they are aware of this. Upon admission she was found to have undetectable TSH with free T4 of 5.4.  She was admitted to the hospital for thyrotoxicosis and started on Tapazole. During her hospitalization she was also noted to have urinary tract infection therefore started on Rocephin which was changed to Augmentin at the time of discharge.  Following day I saw the patient with the help of interpreter as she only spoke Clydie BraunKaren (interpreter ID [id number]).  Patient did not have any complaints and was eager to go home with outpatient follow-up recommendations as stated above.  I explicitly explained her how important it was to  remain compliant with her medications. She has reached maximum benefit from an hospital stay and stable to be discharged with outpatient follow-up recommendations as stated above.  Discharge Diagnoses:  Principal Problem:   Thyrotoxicosis Active Problems:   Elevated LFTs   Acute lower UTI   Headache  Thyrotoxicosis Medication noncompliance - Patient will be prescribed Tapazole 30 mg to be taken daily.  She needs to follow-up with outpatient endocrinology, Dr Sharl Makerr in 2-4 weeks.  She needs a repeat lab work in about 4 weeks as well.  I have explained her the importance to remain compliant with these medications and she understands.  All the conversation taking place using the interpreter line, interpreter ID stated as above  Urinary tract infection without hematuria -We will switch Rocephin to Vantin to be taken orally to complete 5-day course  Generalized itching - Apparently this had started after patient had Nexplanon placed.  She has appointment with GYN tomorrow for the removal of this.  Discharge Instructions   Allergies as of 04/11/2018   No Known Allergies     Medication List    STOP taking these medications   eucerin cream     TAKE these medications   atorvastatin 20 MG tablet Commonly known as:  LIPITOR Take 20 mg by mouth daily.   cefpodoxime 100 MG tablet Commonly known as:  VANTIN Take 1 tablet (100 mg total) by mouth 2 (two) times daily for 5 days.   hydrALAZINE 25 MG tablet Commonly known as:  APRESOLINE Take 25 mg by mouth 3 (three) times daily.   hydrOXYzine 25 MG tablet Commonly known as:  ATARAX/VISTARIL Take 1 tablet (25 mg total) by mouth every 6 (six) hours.   methimazole 10 MG tablet Commonly known as:  TAPAZOLE Take 3 tablets (30 mg total) by mouth daily.   metoprolol tartrate 50 MG tablet Commonly known as:  LOPRESSOR Take 1 tablet (50 mg total) by mouth 2 (two) times daily.   predniSONE 5 MG tablet Commonly known as:  DELTASONE Take  5 mg by mouth daily with breakfast.      Follow-up Information    [facility]. Schedule an appointment as soon as possible for a visit in 1 week(s).   Specialty:  Pediatrics Contact information: [address] [place] [address] [phone]        Talmage Coin, MD. Schedule an appointment as soon as possible for a visit in 2 week(s).   Specialty:  Endocrinology Contact information: [address] [place] [address] [phone]          No Known Allergies  You were cared for by a hospitalist during your hospital stay. If you have any questions about your discharge medications or the care you received while you were in the hospital after you are discharged, you can call the unit and asked to speak with the hospitalist on call if the hospitalist that took care of you is not available. Once you are discharged, your primary care physician will handle any further medical issues. Please note that no refills for any discharge medications will be authorized once you are discharged, as it is imperative that you return to your primary care physician (or establish a relationship with a primary care physician if you do not have one) for your aftercare needs so that they can reassess your need for medications and monitor your lab values.  Consultations:  None   Procedures/Studies:  No results found.   Subjective: Spoke with the patient using interpreter line ID [id number].  Nursing staff, pharmacist and case manager present in the room during our conversation. Patient did not have any complaints.  No acute events overnight.  General = no fevers, chills, dizziness, malaise, fatigue HEENT/EYES = negative for pain, redness, loss of vision, double vision, blurred vision, loss of hearing, sore throat, hoarseness, dysphagia Cardiovascular= negative for chest pain, palpitation, murmurs, lower extremity swelling Respiratory/lungs= negative  for shortness of breath, cough, hemoptysis, wheezing, mucus production Gastrointestinal= negative for nausea, vomiting,, abdominal pain, melena, hematemesis Genitourinary= negative for Dysuria, Hematuria, Change in Urinary Frequency MSK = Negative for arthralgia, myalgias, Back Pain, Joint swelling  Neurology= Negative for headache, seizures, numbness, tingling  Psychiatry= Negative for anxiety, depression, suicidal and homocidal ideation Allergy/Immunology= Medication/Food allergy as listed  Skin= Negative for Rash, lesions, ulcers, itching   Discharge Exam: Vitals:   04/10/18 1943 04/11/18 0514  BP: 139/86 124/76  Pulse: (!) 133 96  Resp: 18 16  Temp: 99.4 F (37.4 C) 97.6 F (36.4 C)  SpO2: 98% 100%   Vitals:   04/10/18 1834 04/10/18 1943 04/11/18 0514 04/11/18 1000  BP:  139/86 124/76   Pulse:  (!) 133 96   Resp:  18 16   Temp: 98.7 F (37.1 C) 99.4 F (37.4 C) 97.6 F (36.4 C)   TempSrc: Oral     SpO2:  98% 100%   Weight:    51.7 kg (113 lb 15.7 oz)    General: Pt is alert, awake, not in acute distress Cardiovascular: RRR, S1/S2 +, no rubs, no gallops Respiratory: CTA bilaterally, no  wheezing, no rhonchi Abdominal: Soft, NT, ND, bowel sounds + Extremities: no edema, no cyanosis    The results of significant diagnostics from this hospitalization (including imaging, microbiology, ancillary and laboratory) are listed below for reference.     Microbiology: No results found for this or any previous visit (from the past 240 hour(s)).   Labs: BNP (last 3 results) No results for input(s): BNP in the last 8760 hours. Basic Metabolic Panel: Recent Labs  Lab 04/10/18 1036 04/11/18 0450  NA 137 143  K 3.1* 3.1*  CL 102 110  CO2 27 24  GLUCOSE 207* 146*  BUN 9 11  CREATININE 0.38* 0.35*  CALCIUM 10.8* 10.9*   Liver Function Tests: Recent Labs  Lab 04/10/18 1036 04/11/18 0450  AST 78* 45*  ALT 98* 75*  ALKPHOS 146* 124  BILITOT 0.5 0.7  PROT 7.2 6.3*   ALBUMIN 3.5 3.2*   No results for input(s): LIPASE, AMYLASE in the last 168 hours. No results for input(s): AMMONIA in the last 168 hours. CBC: Recent Labs  Lab 04/10/18 1036  WBC 4.5  NEUTROABS 2.0  HGB 14.3  HCT 40.2  MCV 81.7  PLT 158   Cardiac Enzymes: No results for input(s): CKTOTAL, CKMB, CKMBINDEX, TROPONINI in the last 168 hours. BNP: Invalid input(s): POCBNP CBG: No results for input(s): GLUCAP in the last 168 hours. D-Dimer No results for input(s): DDIMER in the last 72 hours. Hgb A1c No results for input(s): HGBA1C in the last 72 hours. Lipid Profile No results for input(s): CHOL, HDL, LDLCALC, TRIG, CHOLHDL, LDLDIRECT in the last 72 hours. Thyroid function studies Recent Labs    04/10/18 1036  TSH <0.010*   Anemia work up No results for input(s): VITAMINB12, FOLATE, FERRITIN, TIBC, IRON, RETICCTPCT in the last 72 hours. Urinalysis    Component Value Date/Time   COLORURINE YELLOW 04/10/2018 1110   APPEARANCEUR HAZY (A) 04/10/2018 1110   LABSPEC 1.010 04/10/2018 1110   PHURINE 7.0 04/10/2018 1110   GLUCOSEU 250 (A) 04/10/2018 1110   HGBUR TRACE (A) 04/10/2018 1110   BILIRUBINUR NEGATIVE 04/10/2018 1110   KETONESUR NEGATIVE 04/10/2018 1110   PROTEINUR NEGATIVE 04/10/2018 1110   NITRITE POSITIVE (A) 04/10/2018 1110   LEUKOCYTESUR SMALL (A) 04/10/2018 1110   Sepsis Labs Invalid input(s): PROCALCITONIN,  WBC,  LACTICIDVEN Microbiology No results found for this or any previous visit (from the past 240 hour(s)).   Time coordinating discharge:  I have spent 35 minutes face to face with the patient and on the ward discussing the patients care, assessment, plan and disposition with other care givers. >50% of the time was devoted counseling the patient about the risks and benefits of treatment/Discharge disposition and coordinating care.   SIGNED:   Dimple Nanas, MD  [facility] 04/11/2018, 11:55 AM Pager   If 7PM-7AM, please contact  night-coverage [email or web address] Password [email or web address]

## 2018-04-11 NOTE — Care Management Note (Signed)
Case Management Note  Patient Details  Name: Angie White MRN: [id number] Date of Birth: 11/03/1975  Subjective/Objective:                    Action/Plan:   Expected Discharge Date:  04/11/18               Expected Discharge Plan:  Home/Self Care  In-House Referral:     Discharge planning Services  CM Consult  Post Acute Care Choice:    Choice offered to:     DME Arranged:    DME Agency:     HH Arranged:    HH Agency:     Status of Service:  Completed, signed off  If discussed at [employer] Length of Stay Meetings, dates discussed:    Additional CommentsGeni Bers:  Angie Berlanga, RN 04/11/2018, 3:44 PM

## 2018-04-11 NOTE — Progress Notes (Signed)
RN reviewed and given discharge instructions to patient and her son with the help of translator Kelvin id number [phone].  Pt was educated with the teach back method and pt was able to answer Rn questions correctly. Pt knows to follow up with [facility]ad doctor within a week and to call endocrinologist for an appointment in two weeks.No questions or concerns voiced when asked. RN removed IV site with catheter tip intact. Pt was told that she could get dressed into her street clothes if she would like to and that RN will order her lunch tray. Pt ride ETA is an hour. Pt left in room with pt's son. Bed low and locked. Call bell within reach.

## 2018-04-12 LAB — URINE CULTURE: CULTURE: NO GROWTH

## 2018-09-28 ENCOUNTER — Encounter (HOSPITAL_BASED_OUTPATIENT_CLINIC_OR_DEPARTMENT_OTHER): Payer: Self-pay | Admitting: Emergency Medicine

## 2018-09-28 ENCOUNTER — Emergency Department (HOSPITAL_BASED_OUTPATIENT_CLINIC_OR_DEPARTMENT_OTHER)
Admission: EM | Admit: 2018-09-28 | Discharge: 2018-09-28 | Disposition: A | Discharge status: Home / Self Care | Payer: Medicaid Other | Attending: Emergency Medicine | Admitting: Emergency Medicine

## 2018-09-28 ENCOUNTER — Other Ambulatory Visit: Payer: Self-pay

## 2018-09-28 ENCOUNTER — Emergency Department (HOSPITAL_BASED_OUTPATIENT_CLINIC_OR_DEPARTMENT_OTHER): Payer: Medicaid Other

## 2018-09-28 DIAGNOSIS — R509 Fever, unspecified: Secondary | ICD-10-CM | POA: Insufficient documentation

## 2018-09-28 LAB — URINALYSIS, ROUTINE W REFLEX MICROSCOPIC
BILIRUBIN URINE: NEGATIVE
Glucose, UA: NEGATIVE mg/dL
Hgb urine dipstick: NEGATIVE
KETONES UR: NEGATIVE mg/dL
LEUKOCYTES UA: NEGATIVE
NITRITE: NEGATIVE
PH: 5.5 (ref 5.0–8.0)
Protein, ur: NEGATIVE mg/dL

## 2018-09-28 LAB — COMPREHENSIVE METABOLIC PANEL
ALBUMIN: 3.6 g/dL (ref 3.5–5.0)
ALT: 41 U/L (ref 0–44)
ANION GAP: 8 (ref 5–15)
AST: 44 U/L — ABNORMAL HIGH (ref 15–41)
Alkaline Phosphatase: 135 U/L — ABNORMAL HIGH (ref 38–126)
BUN: 11 mg/dL (ref 6–20)
CALCIUM: 10.1 mg/dL (ref 8.9–10.3)
CO2: 25 mmol/L (ref 22–32)
Chloride: 101 mmol/L (ref 98–111)
Creatinine, Ser: 0.36 mg/dL — ABNORMAL LOW (ref 0.44–1.00)
GFR calc non Af Amer: >60 mL/min (ref >60)
GLUCOSE: 145 mg/dL — AB (ref 70–99)
POTASSIUM: 3.7 mmol/L (ref 3.5–5.1)
SODIUM: 134 mmol/L — AB (ref 135–145)
Total Bilirubin: 0.3 mg/dL (ref 0.3–1.2)
Total Protein: 7.9 g/dL (ref 6.5–8.1)

## 2018-09-28 LAB — CBC
HCT: 40.6 % (ref 36.0–46.0)
Hemoglobin: 13 g/dL (ref 12.0–15.0)
MCH: 28 pg (ref 26.0–34.0)
MCHC: 32 g/dL (ref 30.0–36.0)
MCV: 87.5 fL (ref 80.0–100.0)
PLATELETS: 274 10*3/uL (ref 150–400)
RBC: 4.64 MIL/uL (ref 3.87–5.11)
RDW: 13.2 % (ref 11.5–15.5)
WBC: 12.2 10*3/uL — AB (ref 4.0–10.5)
nRBC: 0 % (ref 0.0–0.2)

## 2018-09-28 LAB — PREGNANCY, URINE: PREG TEST UR: NEGATIVE

## 2018-09-28 LAB — LIPASE, BLOOD: Lipase: 37 U/L (ref 11–51)

## 2018-09-28 MED ORDER — OMEPRAZOLE 20 MG PO CPDR: 20.0000 mg | DELAYED_RELEASE_CAPSULE | Freq: Every day | ORAL | 0 refills | Status: AC

## 2018-09-28 MED ORDER — SODIUM CHLORIDE 0.9 % IV BOLUS
1000.0000 mL | Freq: Once | INTRAVENOUS | Status: AC
  Administered 2018-09-28: 1000 mL via INTRAVENOUS

## 2018-09-28 MED ORDER — ONDANSETRON HCL 4 MG/2ML IJ SOLN
4.0000 mg | Freq: Once | INTRAMUSCULAR | Status: AC
  Administered 2018-09-28: 4 mg via INTRAVENOUS
  Filled 2018-09-28: qty 2

## 2018-09-28 MED ORDER — DIPHENHYDRAMINE HCL 25 MG PO CAPS
25.0000 mg | ORAL_CAPSULE | Freq: Once | ORAL | Status: AC
  Administered 2018-09-28: 25 mg via ORAL
  Filled 2018-09-28: qty 1

## 2018-09-28 NOTE — ED Notes (Signed)
PT SPEAKS KAREN. Son  With her speaks LIMITED english.

## 2018-09-28 NOTE — ED Triage Notes (Signed)
Fever and vomiting which began 1 week ago.  EDP at the bedside.

## 2018-09-28 NOTE — ED Provider Notes (Signed)
[facility] EMERGENCY DEPARTMENT Provider Note   CSN: [id number] Arrival date & time: 09/28/18  1024     History   Chief Complaint No chief complaint on file.  [name] interpreter utilized   HPI Angie White is a 42 y.o. female.  HPI Patient is a 42 year old female who speaks [name] presenting with increased belching which was initially thought to be vomiting by staff during use of her son as an interpreter.  She denies diarrhea.  She reports some painful urination but reports that is now resolved today.  It have been present for 2 to 3 days.  She reports low-grade fever and mild sore throat.  No difficulty breathing or swallowing.  Denies abdominal pain.  Denies chest pain.   Past Medical History:  Diagnosis Date  . Hyperthyroidism   . SIRS (systemic inflammatory response syndrome) [facility])     Patient Active Problem List   Diagnosis Date Noted  . Hyperthyroidism 04/10/2018  . Acute lower UTI 04/10/2018  . Headache 04/10/2018  . Thyrotoxicosis 10/02/2017  . SIRS (systemic inflammatory response syndrome) (HCC) 10/02/2017  . Elevated LFTs 10/02/2017    History reviewed. No pertinent surgical history.   OB History   No obstetric history on file.      Home Medications    Prior to Admission medications   Not on File    Family History Family History  Problem Relation Age of Onset  . Diabetes Mellitus II Neg Hx     Social History Social History   Tobacco Use  . Smoking status: Never Smoker  . Smokeless tobacco: Never Used  Substance Use Topics  . Alcohol use: No    Frequency: Never  . Drug use: No     Allergies   Patient has no known allergies.   Review of Systems Review of Systems  All other systems reviewed and are negative.    Physical Exam Updated Vital Signs BP 131/76 (BP Location: Left Arm)   Pulse (!) 114   Temp 98.8 F (37.1 C) (Oral)   Resp 20   Ht 5\' 2"  (1.575 m)   Wt 50.3 kg   SpO2 98%   BMI 20.28 kg/m   Physical  Exam Vitals signs and nursing note reviewed.  Constitutional:      General: She is not in acute distress.    Appearance: She is well-developed.  HENT:     Head: Normocephalic and atraumatic.  Neck:     Musculoskeletal: Normal range of motion and neck supple.  Cardiovascular:     Rate and Rhythm: Normal rate and regular rhythm.     Heart sounds: Normal heart sounds.  Pulmonary:     Effort: Pulmonary effort is normal.     Breath sounds: Normal breath sounds.  Abdominal:     General: There is no distension.     Palpations: Abdomen is soft.     Tenderness: There is no abdominal tenderness.  Musculoskeletal: Normal range of motion.  Lymphadenopathy:     Cervical: No cervical adenopathy.  Skin:    General: Skin is warm and dry.  Neurological:     Mental Status: She is alert and oriented to person, place, and time.  Psychiatric:        Judgment: Judgment normal.      ED Treatments / Results  Labs (all labs ordered are listed, but only abnormal results are displayed) Labs Reviewed  CBC - Abnormal; Notable for the following components:      Result Value  WBC 12.2 (*)    All other components within normal limits  COMPREHENSIVE METABOLIC PANEL - Abnormal; Notable for the following components:   Sodium 134 (*)    Glucose, Bld 145 (*)    Creatinine, Ser 0.36 (*)    AST 44 (*)    Alkaline Phosphatase 135 (*)    All other components within normal limits  URINALYSIS, ROUTINE W REFLEX MICROSCOPIC - Abnormal; Notable for the following components:   Specific Gravity, Urine <1.005 (*)    All other components within normal limits  LIPASE, BLOOD  PREGNANCY, URINE    EKG None  Radiology Dg Chest 2 View  Result Date: 09/28/2018 CLINICAL DATA:  Fever and rash. EXAM: CHEST - 2 VIEW COMPARISON:  October 20, 2017 FINDINGS: The heart size and mediastinal contours are within normal limits. Both lungs are clear. The visualized skeletal structures are unremarkable. IMPRESSION: No active  cardiopulmonary disease. Electronically Signed   By: Gerome Samavid  Williams III M.D   On: 09/28/2018 11:17    Procedures Procedures   Medications Ordered in ED Medications  ondansetron [facility](ZOFRAN) injection 4 mg (4 mg Intravenous Given 09/28/18 1119)  sodium chloride 0.9 % bolus 1,000 mL (1,000 mLs Intravenous New Bag/Given 09/28/18 1119)  diphenhydrAMINE (BENADRYL) capsule 25 mg (25 mg Oral Given 09/28/18 1117)     Initial Impression / Assessment and Plan / ED Course  I have reviewed the triage vital signs and the nursing notes.  Pertinent labs & imaging results that were available during my care of the patient were reviewed by me and considered in my medical decision making (see chart for details).     Patient is overall well-appearing.  Vital signs are stable emergency department.  Feels better after IV fluids.  Urine without signs of infection at this time.  Close primary care follow-up.  Patient encouraged to return to the emergency department for new or worsening symptoms.  Repeat abdominal exam without tenderness.  No vomiting at home.  This was increased belching.  She will be placed on Prilosec.  Final Clinical Impressions(s) / ED Diagnoses   Final diagnoses:  Fever, unspecified fever cause    ED Discharge Orders    None       Azalia Bilisampos, Shaarav Ripple, MD 09/28/18 1243

## 2019-03-16 IMAGING — CT CT HEAD W/O CM
3 series · 16 of 47 positions shown, 19 images · non-contrast
Comparison: None.

CLINICAL DATA: Headache.

EXAM:
CT HEAD WITHOUT CONTRAST
TECHNIQUE: Contiguous axial images were obtained from the base of the skull
through the vertex without intravenous contrast.

[Series 2: head wo · axial · 0.39mm/px · z∈[-157,-32]mm · 10 of 30 slices shown, 13 images]
[im 3/30  brain]
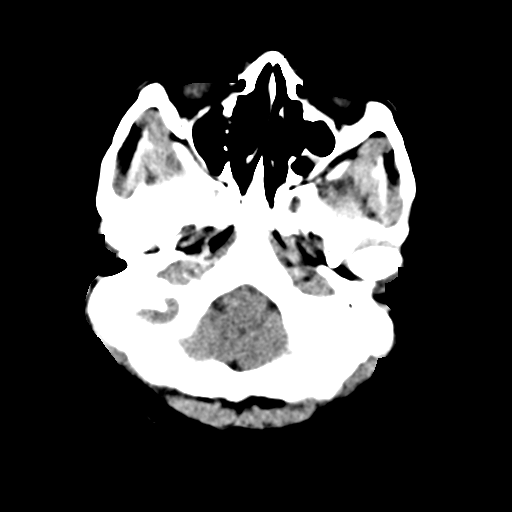
[im 3/30  bone]
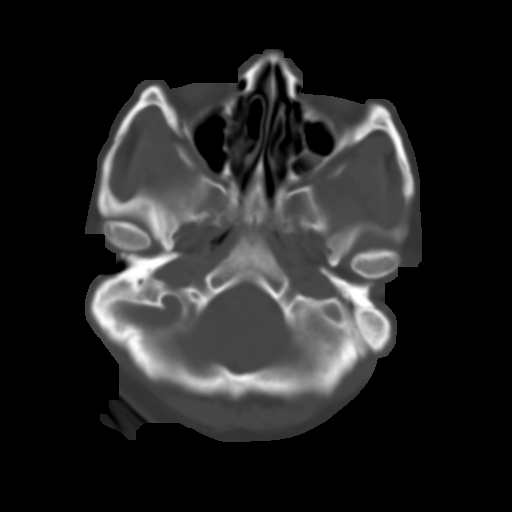
[im 6/30  brain]
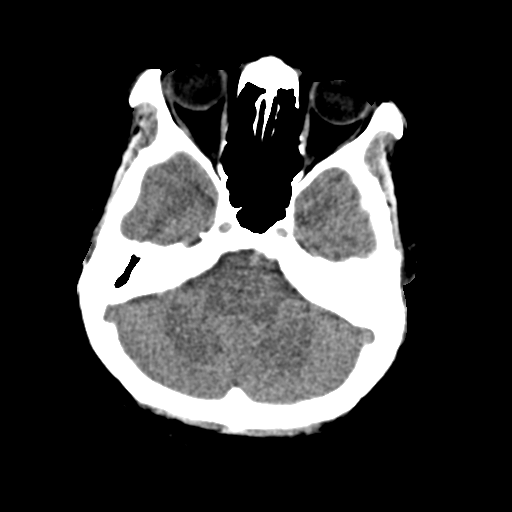
[im 9/30  brain]
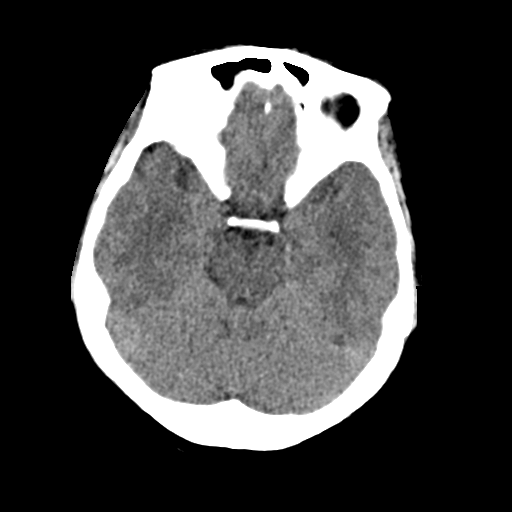
[im 11/30  brain]
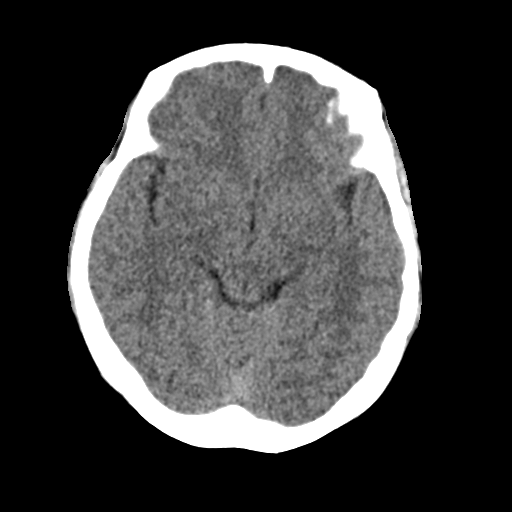
[im 14/30  brain]
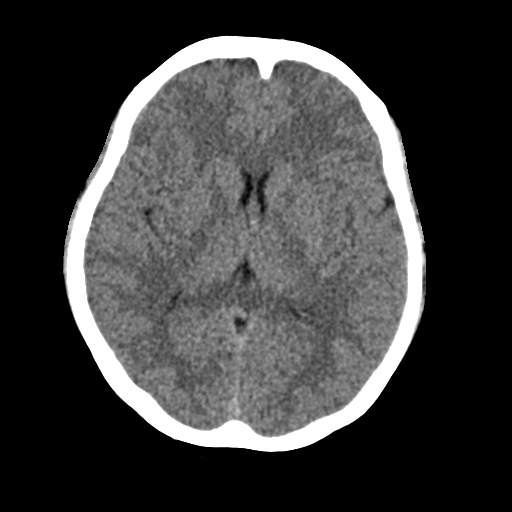
[im 14/30  bone]
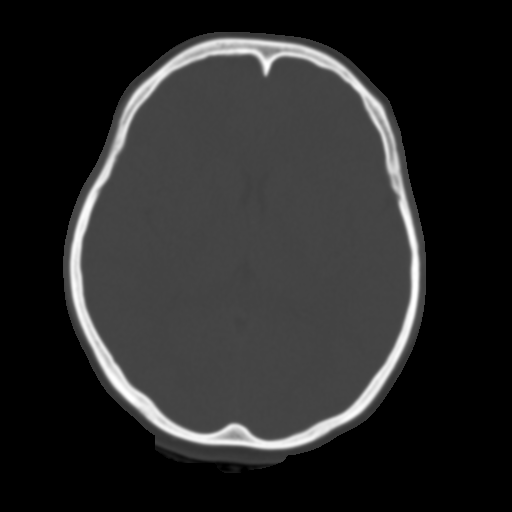
[im 17/30  brain]
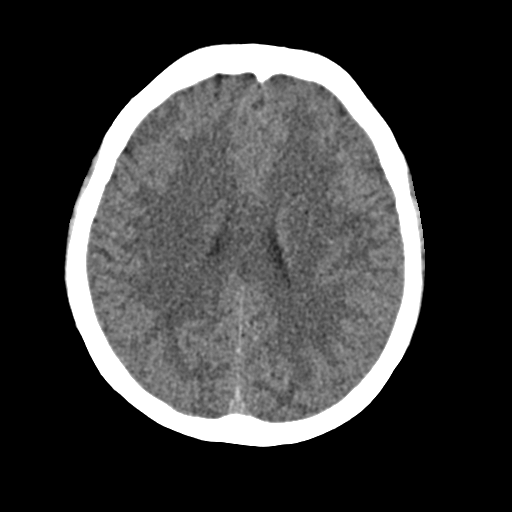
[im 20/30  brain]
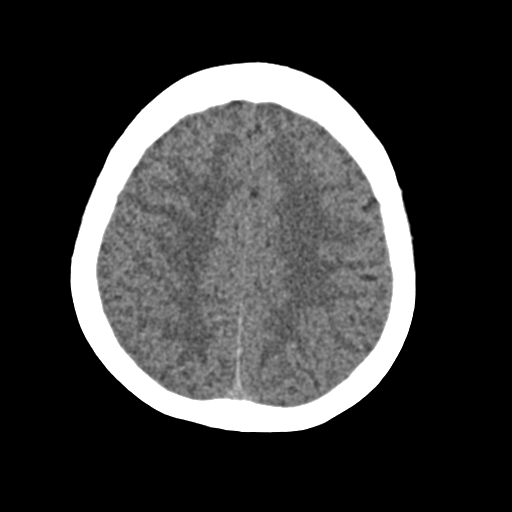
[im 23/30  brain]
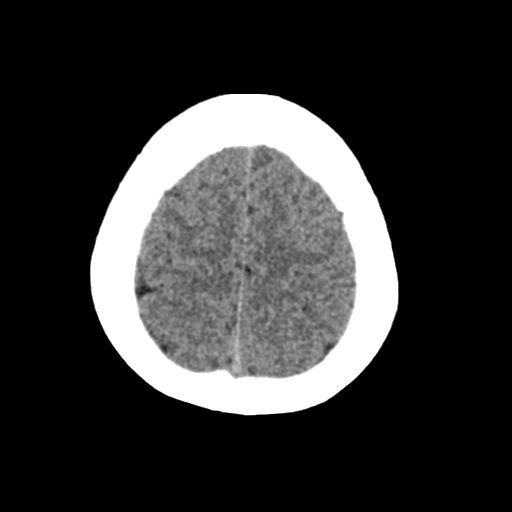
[im 25/30  brain]
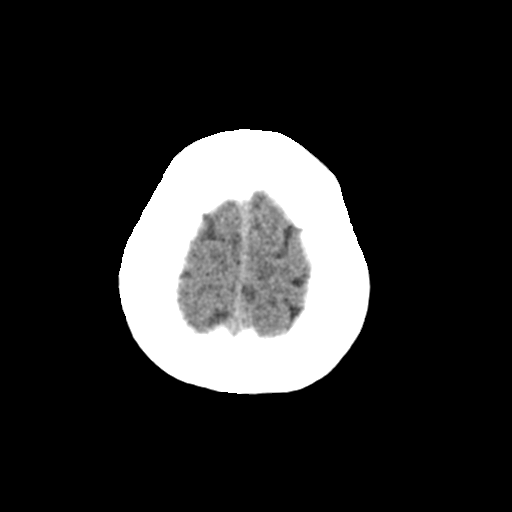
[im 25/30  bone]
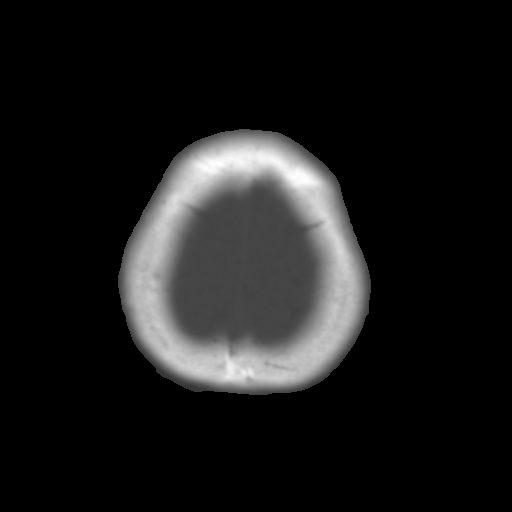
[im 28/30  brain]
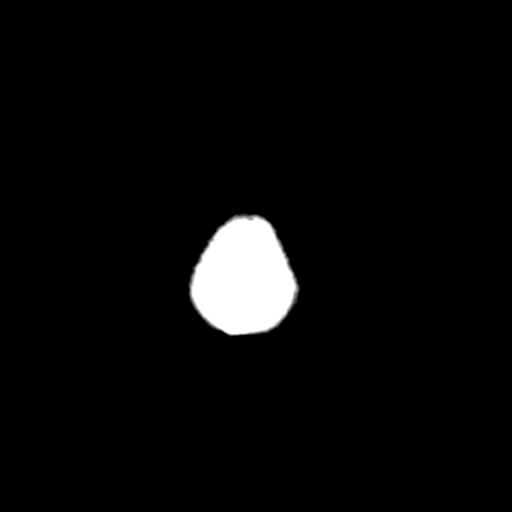

[Series 4: coronal soft · coronal · 0.29mm/px · 3 of 58 slices shown]
[im 20/58  brain]
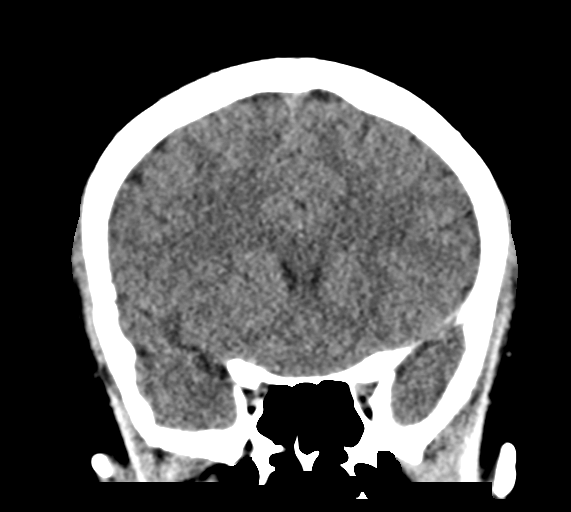
[im 26/58  brain]
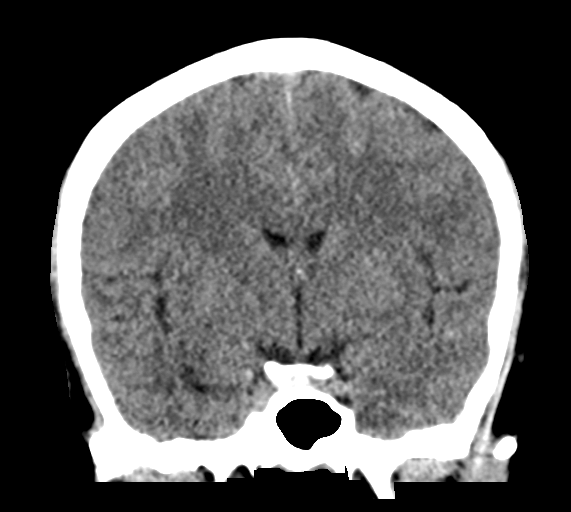
[im 32/58  brain]
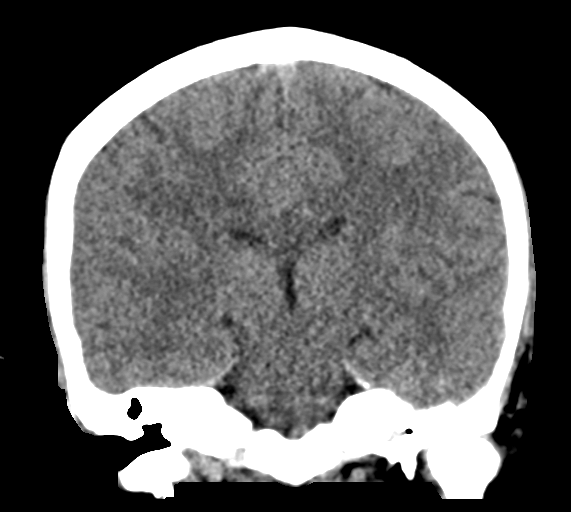

[Series 5: sag soft · sagittal · 0.29mm/px · 3 of 54 slices shown]
[im 18/54  brain]
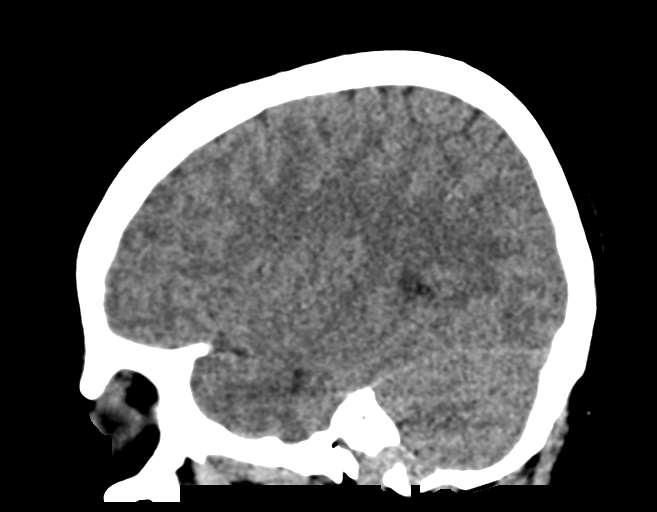
[im 27/54  brain]
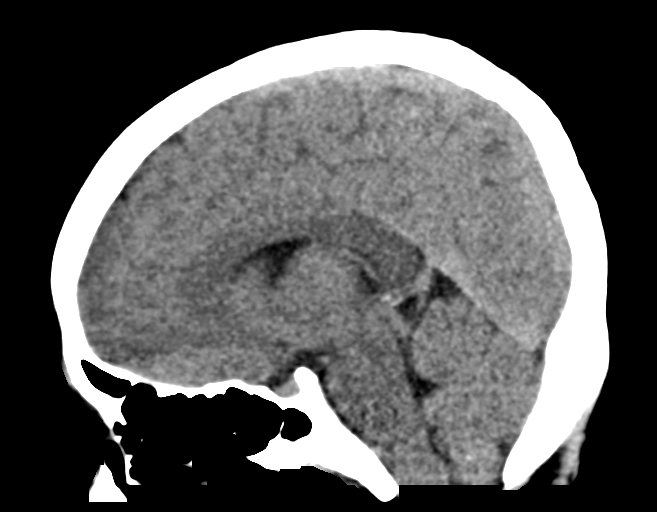
[im 36/54  brain]
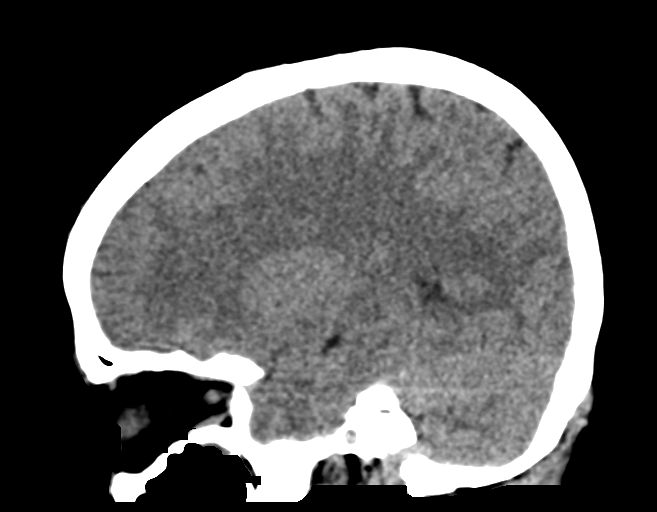

[16 of 47 positions shown; findings below may reference images not displayed]

FINDINGS: Brain: No evidence of acute infarction, hemorrhage, hydrocephalus,
extra-axial collection or mass lesion/mass effect.

Vascular: No hyperdense vessel or unexpected calcification.

Skull: Normal. Negative for fracture or focal lesion.

Sinuses/Orbits: No acute finding.

Other: None.
IMPRESSION: Normal head CT.

## 2019-07-21 IMAGING — US US ABDOMEN LIMITED
1 series · 14 of 25 positions shown · non-contrast
Comparison: None.

CLINICAL DATA: Right upper quadrant abdominal pain with nausea,
vomiting and weight loss for 2 weeks.

EXAM:
ULTRASOUND ABDOMEN LIMITED RIGHT UPPER QUADRANT

[Series 1: us abdomen limited · 0.24mm/px · 14 of 55 slices shown]
[im 1/55]
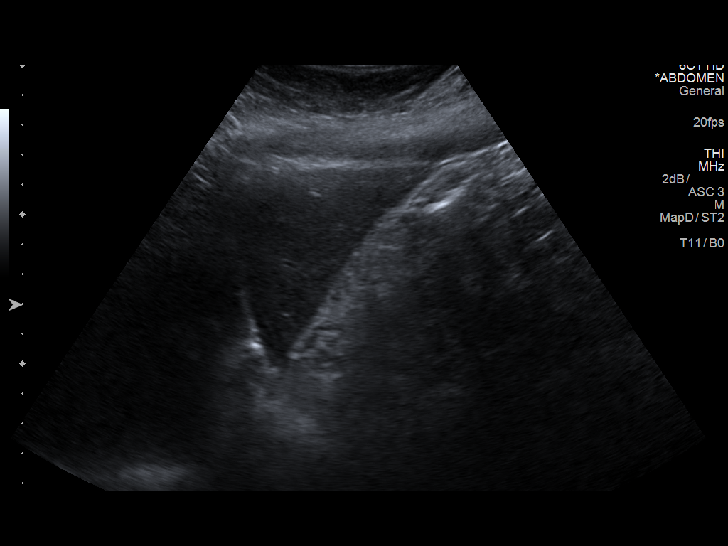
[im 5/55]
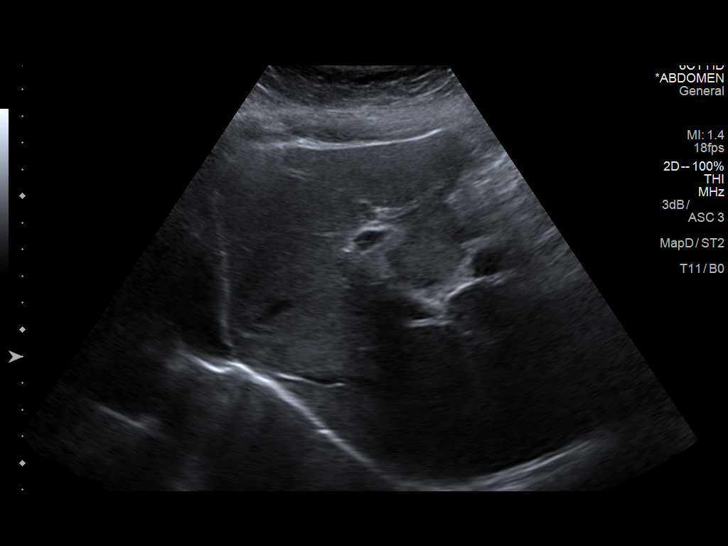
[im 10/55]
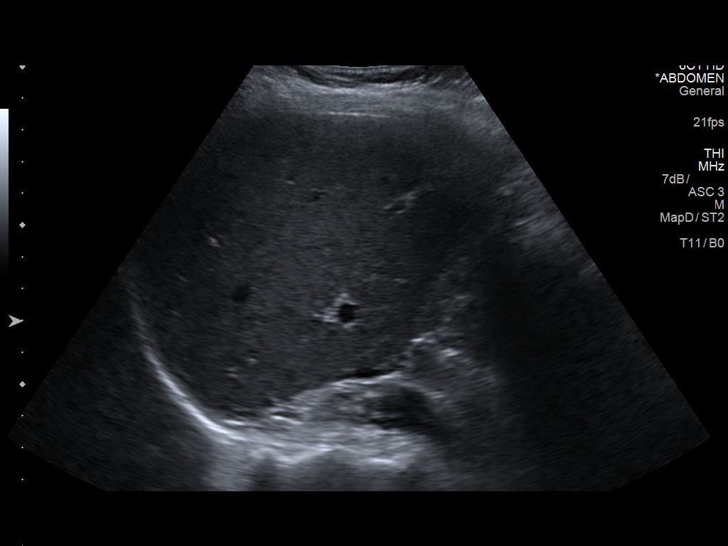
[im 14/55]
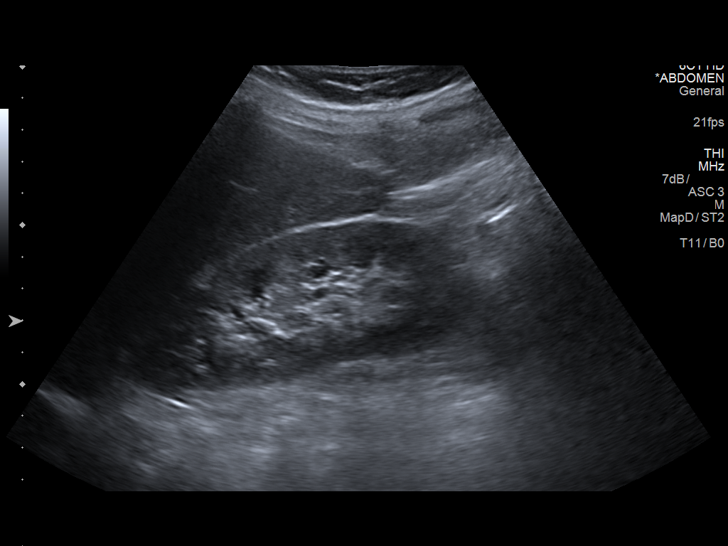
[im 19/55]
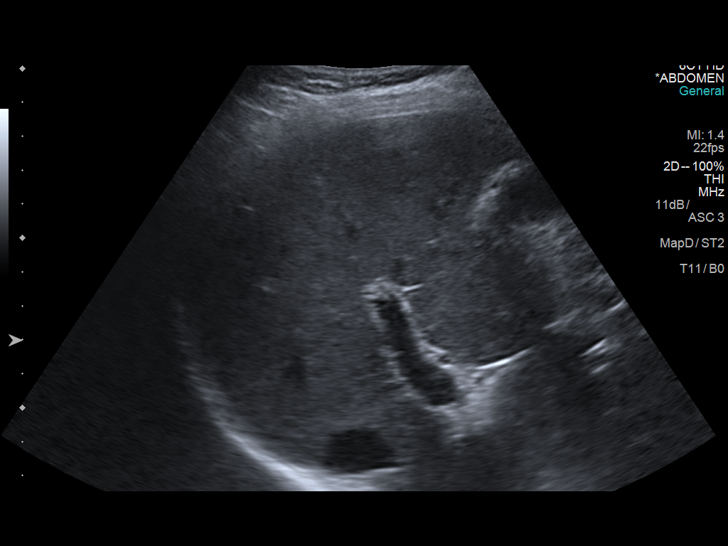
[im 21/55]
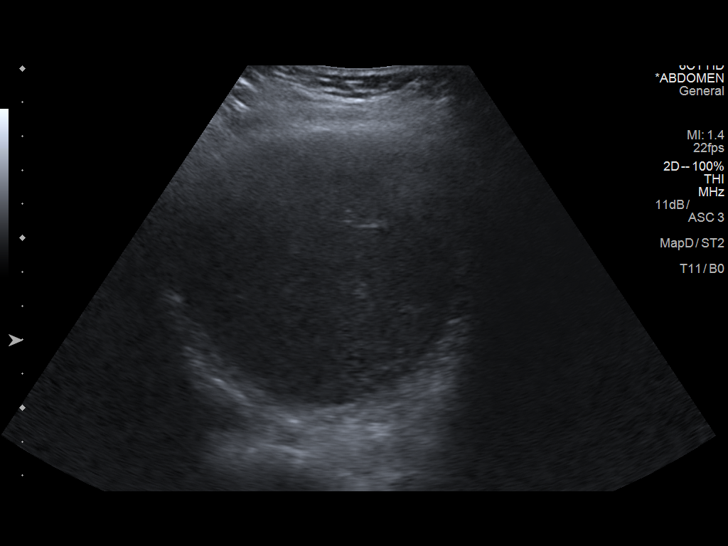
[im 25/55]
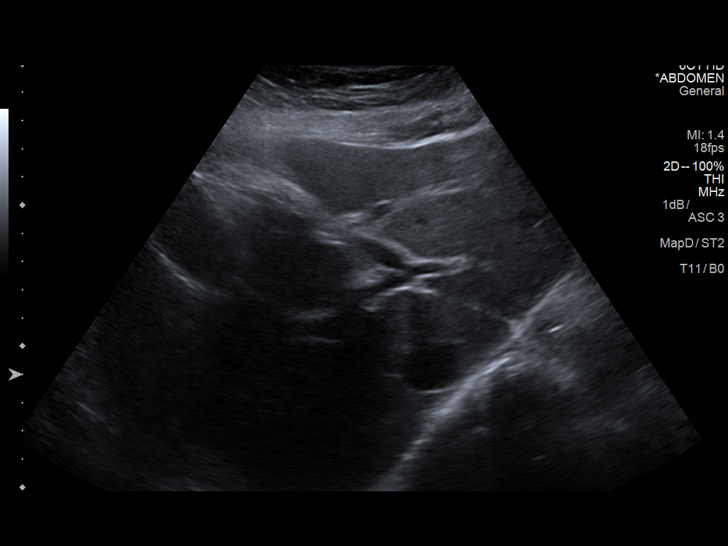
[im 30/55]
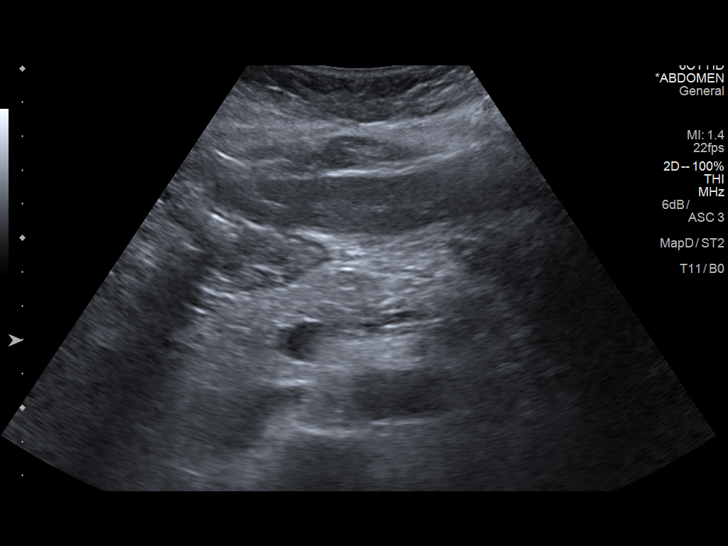
[im 34/55]
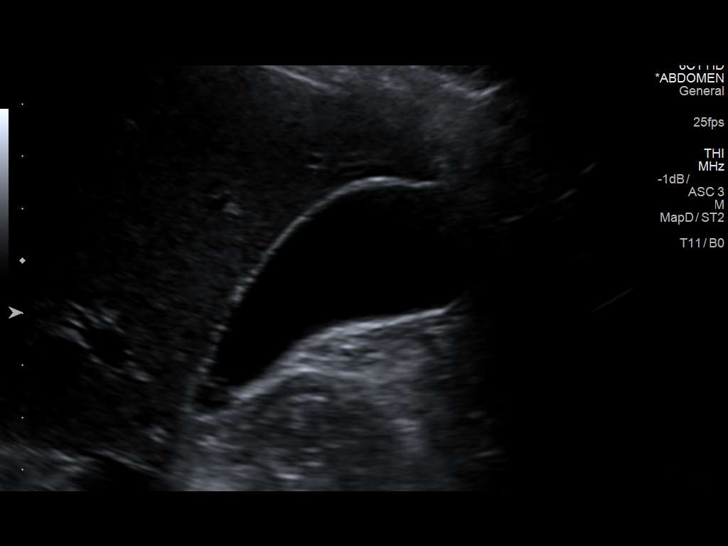
[im 37/55]
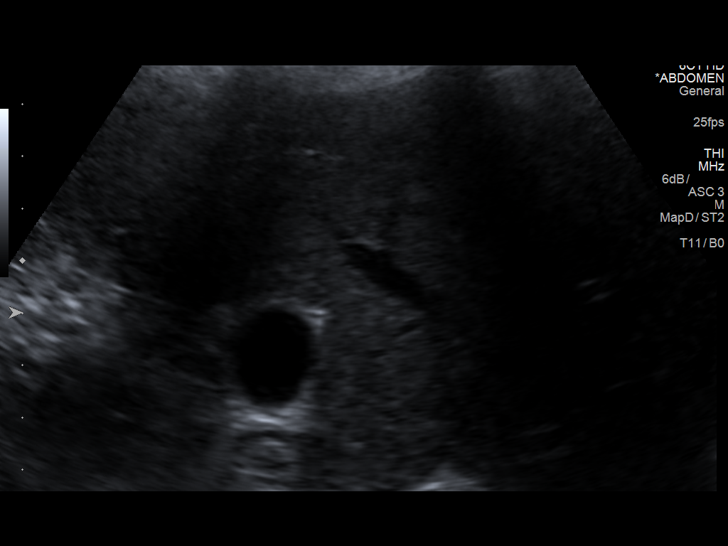
[im 41/55]
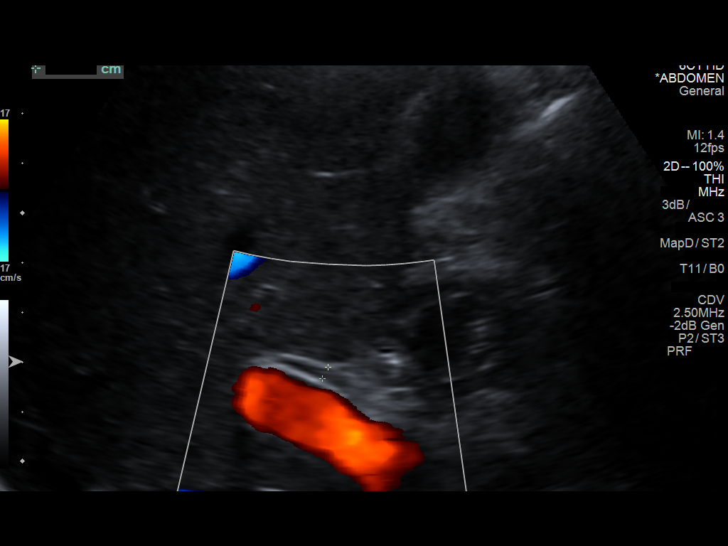
[im 46/55]
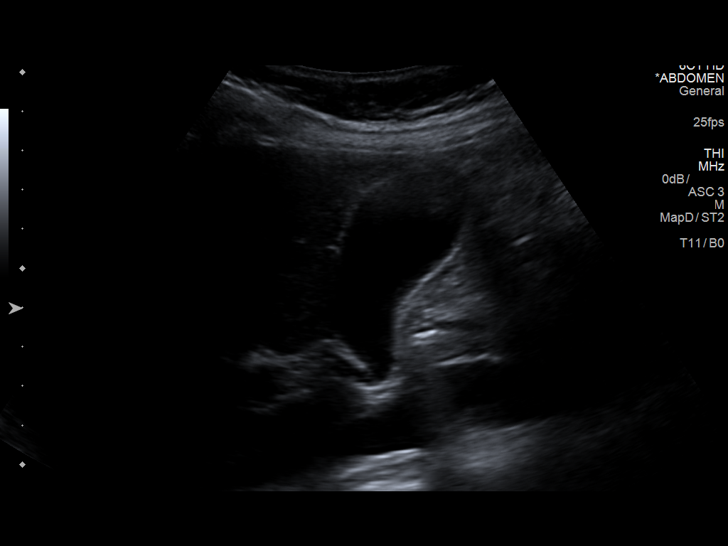
[im 50/55]
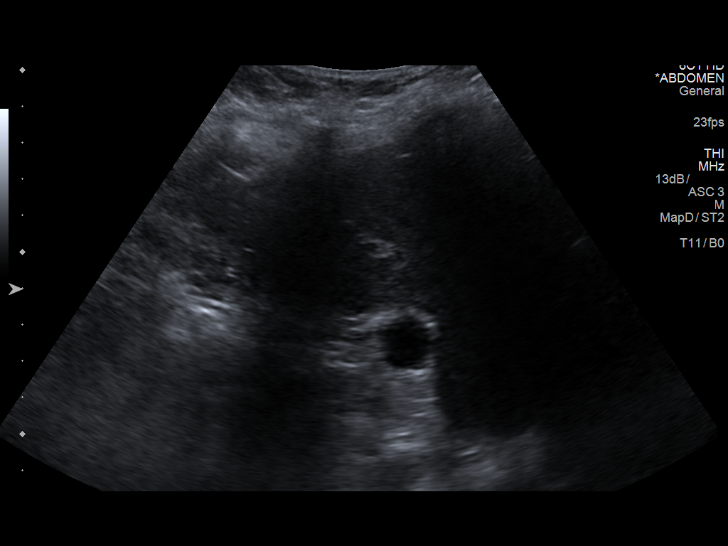
[im 55/55]
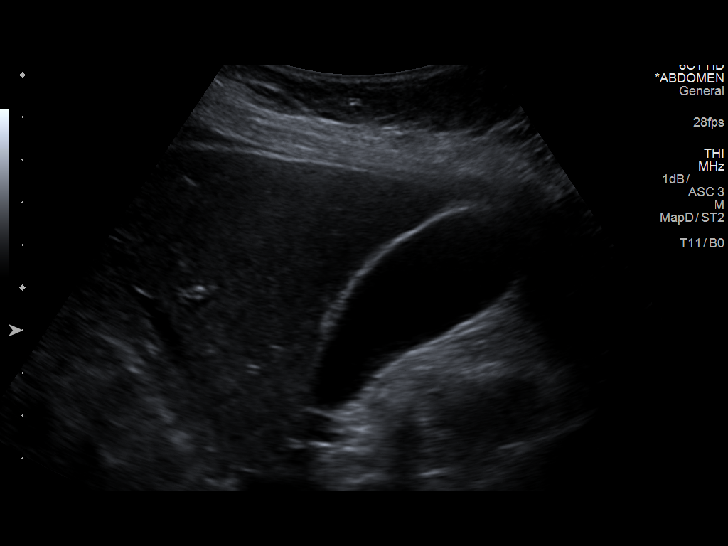

[14 of 25 positions shown; findings below may reference images not displayed]

FINDINGS: Gallbladder:

No gallstones or wall thickening visualized. No sonographic Murphy
sign noted by sonographer.

Common bile duct:

Diameter: 3 mm

Liver:

No focal lesion identified. Within normal limits in parenchymal
echogenicity. Portal vein is patent on color Doppler imaging with
normal direction of blood flow towards the liver.
IMPRESSION: Normal right upper quadrant abdominal ultrasound.

## 2022-08-20 ENCOUNTER — Ambulatory Visit (INDEPENDENT_AMBULATORY_CARE_PROVIDER_SITE_OTHER): Payer: Medicaid Other | Admitting: Family Medicine

## 2022-08-20 ENCOUNTER — Encounter: Payer: Self-pay | Admitting: Family Medicine

## 2022-08-20 DIAGNOSIS — F4310 Post-traumatic stress disorder, unspecified: Secondary | ICD-10-CM

## 2022-08-20 DIAGNOSIS — F39 Unspecified mood [affective] disorder: Secondary | ICD-10-CM
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
